# Patient Record
Sex: Female | Born: 1950 | Race: White | Hispanic: No | Marital: Married | Smoking: Former smoker
Health system: Southern US, Community
[De-identification: ages and names within clinical notes are randomized; demographics above are authoritative.]

## PROBLEM LIST (undated history)

## (undated) DIAGNOSIS — T7840XA Allergy, unspecified, initial encounter: Secondary | ICD-10-CM

## (undated) DIAGNOSIS — H269 Unspecified cataract: Secondary | ICD-10-CM

## (undated) DIAGNOSIS — M1712 Unilateral primary osteoarthritis, left knee: Secondary | ICD-10-CM

## (undated) DIAGNOSIS — I10 Essential (primary) hypertension: Secondary | ICD-10-CM

## (undated) DIAGNOSIS — E785 Hyperlipidemia, unspecified: Secondary | ICD-10-CM

## (undated) HISTORY — DX: Hyperlipidemia, unspecified: E78.5

## (undated) HISTORY — DX: Unilateral primary osteoarthritis, left knee: M17.12

## (undated) HISTORY — DX: Essential (primary) hypertension: I10

## (undated) HISTORY — DX: Allergy, unspecified, initial encounter: T78.40XA

## (undated) HISTORY — DX: Unspecified cataract: H26.9

---

## 1985-01-21 HISTORY — PX: TUBAL LIGATION: SHX77

## 1989-01-21 HISTORY — PX: APPENDECTOMY: SHX54

## 1989-01-21 HISTORY — PX: TOTAL ABDOMINAL HYSTERECTOMY: SHX209

## 1998-04-17 ENCOUNTER — Other Ambulatory Visit: Admission: RE | Admit: 1998-04-17 | Discharge: 1998-04-17 | Payer: Self-pay | Admitting: Obstetrics and Gynecology

## 1998-09-01 ENCOUNTER — Other Ambulatory Visit: Admission: RE | Admit: 1998-09-01 | Discharge: 1998-09-01 | Payer: Self-pay | Admitting: Gastroenterology

## 1998-09-01 ENCOUNTER — Encounter (INDEPENDENT_AMBULATORY_CARE_PROVIDER_SITE_OTHER): Payer: Self-pay | Admitting: Specialist

## 1998-10-09 ENCOUNTER — Encounter (INDEPENDENT_AMBULATORY_CARE_PROVIDER_SITE_OTHER): Payer: Self-pay

## 1998-10-09 ENCOUNTER — Inpatient Hospital Stay (HOSPITAL_COMMUNITY): Admission: EM | Admit: 1998-10-09 | Discharge: 1998-10-10 | Payer: Self-pay | Admitting: *Deleted

## 1999-03-26 ENCOUNTER — Other Ambulatory Visit: Admission: RE | Admit: 1999-03-26 | Discharge: 1999-03-26 | Payer: Self-pay | Admitting: *Deleted

## 2000-04-16 ENCOUNTER — Other Ambulatory Visit: Admission: RE | Admit: 2000-04-16 | Discharge: 2000-04-16 | Payer: Self-pay | Admitting: *Deleted

## 2001-04-28 ENCOUNTER — Other Ambulatory Visit: Admission: RE | Admit: 2001-04-28 | Discharge: 2001-04-28 | Payer: Self-pay | Admitting: *Deleted

## 2002-01-21 HISTORY — PX: CHOLECYSTECTOMY: SHX55

## 2002-05-03 ENCOUNTER — Encounter: Payer: Self-pay | Admitting: Family Medicine

## 2002-05-03 ENCOUNTER — Encounter: Admission: RE | Admit: 2002-05-03 | Discharge: 2002-05-03 | Payer: Self-pay | Admitting: Family Medicine

## 2002-05-05 ENCOUNTER — Other Ambulatory Visit: Admission: RE | Admit: 2002-05-05 | Discharge: 2002-05-05 | Payer: Self-pay | Admitting: *Deleted

## 2003-05-16 ENCOUNTER — Other Ambulatory Visit: Admission: RE | Admit: 2003-05-16 | Discharge: 2003-05-16 | Payer: Self-pay | Admitting: *Deleted

## 2004-03-28 ENCOUNTER — Encounter: Admission: RE | Admit: 2004-03-28 | Discharge: 2004-03-28 | Payer: Self-pay | Admitting: Family Medicine

## 2004-05-23 ENCOUNTER — Other Ambulatory Visit: Admission: RE | Admit: 2004-05-23 | Discharge: 2004-05-23 | Payer: Self-pay | Admitting: *Deleted

## 2005-06-26 ENCOUNTER — Other Ambulatory Visit: Admission: RE | Admit: 2005-06-26 | Discharge: 2005-06-26 | Payer: Self-pay | Admitting: *Deleted

## 2005-12-19 ENCOUNTER — Ambulatory Visit: Payer: Self-pay | Admitting: Family Medicine

## 2006-01-27 ENCOUNTER — Ambulatory Visit: Payer: Self-pay | Admitting: Family Medicine

## 2006-07-07 ENCOUNTER — Other Ambulatory Visit: Admission: RE | Admit: 2006-07-07 | Discharge: 2006-07-07 | Payer: Self-pay | Admitting: *Deleted

## 2006-08-25 ENCOUNTER — Encounter (INDEPENDENT_AMBULATORY_CARE_PROVIDER_SITE_OTHER): Payer: Self-pay | Admitting: Family Medicine

## 2007-01-28 ENCOUNTER — Ambulatory Visit: Payer: Self-pay | Admitting: Family Medicine

## 2007-01-28 DIAGNOSIS — I10 Essential (primary) hypertension: Secondary | ICD-10-CM | POA: Insufficient documentation

## 2007-01-28 LAB — CONVERTED CEMR LAB
BUN: 8 mg/dL (ref 6–23)
CO2: 30 meq/L (ref 19–32)
Calcium: 9.7 mg/dL (ref 8.4–10.5)
Chloride: 104 meq/L (ref 96–112)
Creatinine, Ser: 0.7 mg/dL (ref 0.4–1.2)
Direct LDL: 125.2 mg/dL
VLDL: 35 mg/dL (ref 0–40)

## 2007-01-29 ENCOUNTER — Encounter (INDEPENDENT_AMBULATORY_CARE_PROVIDER_SITE_OTHER): Payer: Self-pay | Admitting: *Deleted

## 2007-02-11 ENCOUNTER — Telehealth (INDEPENDENT_AMBULATORY_CARE_PROVIDER_SITE_OTHER): Payer: Self-pay | Admitting: *Deleted

## 2007-02-16 ENCOUNTER — Telehealth (INDEPENDENT_AMBULATORY_CARE_PROVIDER_SITE_OTHER): Payer: Self-pay | Admitting: *Deleted

## 2007-02-20 ENCOUNTER — Ambulatory Visit: Payer: Self-pay | Admitting: Gastroenterology

## 2007-02-20 LAB — CONVERTED CEMR LAB
ALT: 22 units/L (ref 0–35)
Basophils Relative: 0.7 % (ref 0.0–1.0)
Bilirubin, Direct: 0.1 mg/dL (ref 0.0–0.3)
Eosinophils Absolute: 0.1 10*3/uL (ref 0.0–0.6)
Eosinophils Relative: 2.2 % (ref 0.0–5.0)
HCT: 34.8 % — ABNORMAL LOW (ref 36.0–46.0)
Lymphocytes Relative: 36.8 % (ref 12.0–46.0)
MCV: 89.5 fL (ref 78.0–100.0)
Neutro Abs: 2.5 10*3/uL (ref 1.4–7.7)
Neutrophils Relative %: 50.8 % (ref 43.0–77.0)
Total Bilirubin: 0.7 mg/dL (ref 0.3–1.2)
Total Protein: 6.9 g/dL (ref 6.0–8.3)
WBC: 4.7 10*3/uL (ref 4.5–10.5)

## 2007-02-25 ENCOUNTER — Ambulatory Visit: Payer: Self-pay | Admitting: Cardiology

## 2007-03-04 ENCOUNTER — Ambulatory Visit: Payer: Self-pay | Admitting: Gastroenterology

## 2007-03-04 ENCOUNTER — Telehealth (INDEPENDENT_AMBULATORY_CARE_PROVIDER_SITE_OTHER): Payer: Self-pay | Admitting: *Deleted

## 2007-03-04 ENCOUNTER — Encounter (INDEPENDENT_AMBULATORY_CARE_PROVIDER_SITE_OTHER): Payer: Self-pay | Admitting: Family Medicine

## 2007-07-17 ENCOUNTER — Ambulatory Visit: Payer: Self-pay | Admitting: Family Medicine

## 2007-07-18 LAB — CONVERTED CEMR LAB
BUN: 13 mg/dL (ref 6–23)
Calcium: 9.4 mg/dL (ref 8.4–10.5)
Glucose, Bld: 97 mg/dL (ref 70–99)
Potassium: 3.9 meq/L (ref 3.5–5.3)

## 2007-07-20 ENCOUNTER — Encounter (INDEPENDENT_AMBULATORY_CARE_PROVIDER_SITE_OTHER): Payer: Self-pay | Admitting: *Deleted

## 2007-09-07 ENCOUNTER — Ambulatory Visit: Payer: Self-pay | Admitting: Family Medicine

## 2007-09-07 DIAGNOSIS — J019 Acute sinusitis, unspecified: Secondary | ICD-10-CM | POA: Insufficient documentation

## 2007-10-13 ENCOUNTER — Other Ambulatory Visit: Admission: RE | Admit: 2007-10-13 | Discharge: 2007-10-13 | Payer: Self-pay | Admitting: Gynecology

## 2007-11-23 ENCOUNTER — Ambulatory Visit: Payer: Self-pay | Admitting: Family Medicine

## 2007-11-23 DIAGNOSIS — N390 Urinary tract infection, site not specified: Secondary | ICD-10-CM | POA: Insufficient documentation

## 2007-11-23 LAB — CONVERTED CEMR LAB
Bilirubin Urine: NEGATIVE
Glucose, Urine, Semiquant: NEGATIVE
Protein, U semiquant: NEGATIVE
Specific Gravity, Urine: <1.005
pH: 5

## 2007-11-24 ENCOUNTER — Encounter: Payer: Self-pay | Admitting: Family Medicine

## 2007-12-01 ENCOUNTER — Telehealth (INDEPENDENT_AMBULATORY_CARE_PROVIDER_SITE_OTHER): Payer: Self-pay | Admitting: *Deleted

## 2008-03-15 ENCOUNTER — Ambulatory Visit: Payer: Self-pay | Admitting: Family Medicine

## 2008-03-15 LAB — CONVERTED CEMR LAB
Glucose, Urine, Semiquant: NEGATIVE
Nitrite: NEGATIVE
Specific Gravity, Urine: 1.02
pH: 6

## 2008-03-16 ENCOUNTER — Encounter: Payer: Self-pay | Admitting: Family Medicine

## 2008-03-23 ENCOUNTER — Encounter (INDEPENDENT_AMBULATORY_CARE_PROVIDER_SITE_OTHER): Payer: Self-pay | Admitting: *Deleted

## 2008-06-07 ENCOUNTER — Ambulatory Visit: Payer: Self-pay | Admitting: Family Medicine

## 2008-06-07 DIAGNOSIS — L255 Unspecified contact dermatitis due to plants, except food: Secondary | ICD-10-CM | POA: Insufficient documentation

## 2008-06-14 ENCOUNTER — Telehealth (INDEPENDENT_AMBULATORY_CARE_PROVIDER_SITE_OTHER): Payer: Self-pay | Admitting: *Deleted

## 2008-08-03 LAB — CONVERTED CEMR LAB: Pap Smear: NORMAL

## 2008-09-19 ENCOUNTER — Ambulatory Visit: Payer: Self-pay | Admitting: Family Medicine

## 2008-09-19 DIAGNOSIS — J069 Acute upper respiratory infection, unspecified: Secondary | ICD-10-CM | POA: Insufficient documentation

## 2009-03-16 ENCOUNTER — Ambulatory Visit: Payer: Self-pay | Admitting: Family Medicine

## 2009-03-16 ENCOUNTER — Encounter: Payer: Self-pay | Admitting: Internal Medicine

## 2009-03-16 DIAGNOSIS — M810 Age-related osteoporosis without current pathological fracture: Secondary | ICD-10-CM | POA: Insufficient documentation

## 2009-03-16 LAB — CONVERTED CEMR LAB
Ketones, urine, test strip: NEGATIVE
Nitrite: NEGATIVE
Specific Gravity, Urine: 1.02
Urobilinogen, UA: NEGATIVE
WBC Urine, dipstick: NEGATIVE

## 2009-03-17 ENCOUNTER — Telehealth (INDEPENDENT_AMBULATORY_CARE_PROVIDER_SITE_OTHER): Payer: Self-pay | Admitting: *Deleted

## 2009-03-17 ENCOUNTER — Encounter: Payer: Self-pay | Admitting: Family Medicine

## 2009-03-17 LAB — CONVERTED CEMR LAB
Albumin: 4.3 g/dL (ref 3.5–5.2)
BUN: 10 mg/dL (ref 6–23)
Basophils Absolute: 0 10*3/uL (ref 0.0–0.1)
CO2: 30 meq/L (ref 19–32)
Direct LDL: 124.1 mg/dL
Eosinophils Absolute: 0.1 10*3/uL (ref 0.0–0.7)
GFR calc non Af Amer: 77.56 mL/min (ref >60)
Glucose, Bld: 87 mg/dL (ref 70–99)
HCT: 37.2 % (ref 36.0–46.0)
HDL: 51.4 mg/dL (ref >39.00)
Lymphs Abs: 1.6 10*3/uL (ref 0.7–4.0)
MCHC: 33.7 g/dL (ref 30.0–36.0)
MCV: 92.8 fL (ref 78.0–100.0)
Monocytes Absolute: 0.4 10*3/uL (ref 0.1–1.0)
Monocytes Relative: 8.4 % (ref 3.0–12.0)
Neutro Abs: 2.6 10*3/uL (ref 1.4–7.7)
Platelets: 133 10*3/uL — ABNORMAL LOW (ref 150.0–400.0)
Potassium: 3.4 meq/L — ABNORMAL LOW (ref 3.5–5.1)
RDW: 12.4 % (ref 11.5–14.6)
TSH: 0.76 microintl units/mL (ref 0.35–5.50)
Total Bilirubin: 0.5 mg/dL (ref 0.3–1.2)

## 2009-03-21 LAB — CONVERTED CEMR LAB: Vit D, 25-Hydroxy: 30 ng/mL (ref 30–89)

## 2009-05-25 ENCOUNTER — Ambulatory Visit: Payer: Self-pay | Admitting: Family Medicine

## 2009-06-08 ENCOUNTER — Ambulatory Visit: Payer: Self-pay | Admitting: Family Medicine

## 2009-06-08 DIAGNOSIS — E785 Hyperlipidemia, unspecified: Secondary | ICD-10-CM | POA: Insufficient documentation

## 2009-06-09 LAB — CONVERTED CEMR LAB
AST: 31 units/L (ref 0–37)
Albumin: 4.3 g/dL (ref 3.5–5.2)
Alkaline Phosphatase: 66 units/L (ref 39–117)
Calcium: 9.6 mg/dL (ref 8.4–10.5)
Cholesterol: 262 mg/dL — ABNORMAL HIGH (ref 0–200)
GFR calc non Af Amer: 91.92 mL/min (ref >60)
HDL: 67.1 mg/dL (ref >39.00)
Potassium: 5 meq/L (ref 3.5–5.1)
Sodium: 142 meq/L (ref 135–145)
Total Bilirubin: 0.5 mg/dL (ref 0.3–1.2)
Total CHOL/HDL Ratio: 4
VLDL: 42.6 mg/dL — ABNORMAL HIGH (ref 0.0–40.0)
Vit D, 25-Hydroxy: 58 ng/mL (ref 30–89)

## 2009-06-15 ENCOUNTER — Encounter (INDEPENDENT_AMBULATORY_CARE_PROVIDER_SITE_OTHER): Payer: Self-pay | Admitting: *Deleted

## 2009-07-28 ENCOUNTER — Telehealth (INDEPENDENT_AMBULATORY_CARE_PROVIDER_SITE_OTHER): Payer: Self-pay | Admitting: *Deleted

## 2009-08-31 ENCOUNTER — Ambulatory Visit: Payer: Self-pay | Admitting: Family Medicine

## 2009-09-01 LAB — CONVERTED CEMR LAB
ALT: 20 units/L (ref 0–35)
AST: 27 units/L (ref 0–37)
Albumin: 4.2 g/dL (ref 3.5–5.2)
Total Bilirubin: 0.5 mg/dL (ref 0.3–1.2)

## 2009-09-05 ENCOUNTER — Telehealth (INDEPENDENT_AMBULATORY_CARE_PROVIDER_SITE_OTHER): Payer: Self-pay | Admitting: *Deleted

## 2009-09-05 ENCOUNTER — Ambulatory Visit: Payer: Self-pay | Admitting: Family Medicine

## 2009-09-05 LAB — CONVERTED CEMR LAB: Vit D, 25-Hydroxy: 55 ng/mL (ref 30–89)

## 2009-09-19 ENCOUNTER — Ambulatory Visit: Payer: Self-pay | Admitting: Family Medicine

## 2009-09-19 DIAGNOSIS — L259 Unspecified contact dermatitis, unspecified cause: Secondary | ICD-10-CM | POA: Insufficient documentation

## 2009-11-02 ENCOUNTER — Telehealth (INDEPENDENT_AMBULATORY_CARE_PROVIDER_SITE_OTHER): Payer: Self-pay | Admitting: *Deleted

## 2010-01-18 ENCOUNTER — Telehealth (INDEPENDENT_AMBULATORY_CARE_PROVIDER_SITE_OTHER): Payer: Self-pay | Admitting: *Deleted

## 2010-02-22 NOTE — Assessment & Plan Note (Signed)
Summary: poison ivey/kn   Vital Signs:  Patient profile:   60 year old female Weight:      148 pounds Temp:     98.8 degrees F oral Pulse rate:   60 / minute Pulse rhythm:   regular BP sitting:   130 / 90  (left arm)  Vitals Entered By: Aron Baba CMA Deborra Medina) (September 05, 2009 3:38 PM) CC: c/o poison ivy x3 days, Rash   History of Present Illness:  Rash      This is a 60 year old woman who presents with Rash.  The symptoms began 3 days ago.  Pt here c/o poison ivy for 3 days that is spreading.  The patient complains of blisters.  The rash is located on the face, chest, abdomen, and right hand.  The rash is worse with heat and better with cold.  The patient denies the following symptoms: fever, headache, facial swelling, tongue swelling, burning, difficulty breathing, abdominal pain, nausea, vomiting, diarrhea, dizziness, sore throat, dysuria, eye symptoms, arthralgias, and vaginal discharge.  The patient reports a history of new topical exposure.  The patient denies history of recent tick bite, recent tick exposure, other insect bite, recent infection, recent antibiotic use, new medication, new clothing, recent travel, pet/animal contact, thyroid disease, chronic liver disease, autoimmune disease, chronic edema, and prior STD.    Current Medications (verified): 1)  Metoprolol Tartrate 50 Mg  Tabs (Metoprolol Tartrate) .... Take One Tablet Twice Daily 2)  Calcium 500 Mg Tabs (Calcium) .Marland Kitchen.. 1 By Mouth Two Times A Day 3)  Fish Oil  Oil (Fish Oil) 4)  Vitamin D3 2000 Unit Caps (Cholecalciferol) .Marland Kitchen.. 1 By Mouth Daily. 5)  Zocor 40 Mg Tabs (Simvastatin) .Marland Kitchen.. 1 By Mouth At Bedtime 6)  Prednisone 10 Mg Tabs (Prednisone) .... 3 By Mouth Once Daily For 3 Days Then 2 By Mouth Once Daily For 3 Days Then 1 By Mouth Once Daily For 3 Days  Allergies (verified): 1)  ! 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  Past Medical History: Reviewed history from 01/28/2007 and no changes required. Hypertension  Past Surgical History: Reviewed history from 03/16/2009 and no changes required. Appendectomy Cholecystectomy Hysterectomy-- TAH BSO---  secondary fibroids Tubal ligation  Family History: Reviewed history from 03/16/2009 and no changes required. Family History of Colon CA 1st degree relative 62yo Family History of CAD Female 1st degree relative 45s Family History of CAD Female 1st degree relative 62yo Family History of Asthma Family History High cholesterol Family History Hypertension Family History Osteoporosis Family History Thyroid disease Family History of Thromboembolism clotting disorder  Social History: Reviewed history from 03/16/2009 and no changes required. 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 Alert and cooperative with normal attention span and concentration. No apparent delusions, illusions, hallucinations   Impression & Recommendations:  Problem # 1:  CONTACT DERMATITIS&OTHER ECZEMA DUE TO PLANTS (ICD-692.6) pt preferred not to get depo medrol injection Her updated medication list for this problem includes:    Prednisone 10 Mg Tabs (Prednisone) .Marland KitchenMarland KitchenMarland KitchenMarland Kitchen 3 by mouth once daily for 3 days then 2 by mouth once daily for 3 days then 1 by mouth once daily for 3 days  Discussed avoidance of triggers and symptomatic treatment.   Complete Medication List: 1)  Metoprolol Tartrate 50 Mg Tabs (Metoprolol tartrate) .... Take one tablet twice daily 2)  Calcium 500 Mg Tabs (Calcium) .Marland Kitchen.. 1 by mouth two times a day 3)  Fish Oil Oil (Fish oil) 4)  Vitamin D3 2000 Unit Caps (Cholecalciferol) .Marland Kitchen.. 1 by mouth daily. 5)  Zocor 40 Mg Tabs (Simvastatin) .Marland Kitchen.. 1 by mouth at bedtime 6)  Prednisone 10 Mg Tabs (Prednisone) .... 3 by mouth once daily for 3 days then 2 by mouth once daily for 3 days then 1 by mouth once daily for 3 days Prescriptions: PREDNISONE 10 MG TABS (PREDNISONE) 3 by mouth once daily for 3 days then 2 by mouth once daily for 3 days then 1 by mouth once daily for 3 days  #18 x 0   Entered and Authorized by:   Garnet Koyanagi DO   Signed by:   Garnet Koyanagi DO on 09/05/2009   Method used:   Electronically to        [facility] [phone]* (retail)       [address]       [place], [place]  [address]       Ph: [phone]       Fax: [phone]   RxID:   [id number]

## 2010-02-22 NOTE — Miscellaneous (Signed)
  Pt received letter she needed to start Zocor 40 mg and request that the refill be sent to [facility] on [address]. [place]  Jun 15, 2009 9:21 AM Medications Added ZOCOR 40 MG TABS (SIMVASTATIN) 1 by mouth at bedtime       Clinical Lists Changes  Medications: Added new medication of ZOCOR 40 MG TABS (SIMVASTATIN) 1 by mouth at bedtime - Signed Rx of ZOCOR 40 MG TABS (SIMVASTATIN) 1 by mouth at bedtime;  #30 x 2;  Signed;  Entered by: Allyn Kenner CMA;  Authorized by: Garnet Koyanagi DO;  Method used: Electronically to [facility] # [id number]*, [address] [place], [place], [place]  [address], Ph: [phone], Fax: [phone]    Prescriptions: ZOCOR 40 MG TABS (SIMVASTATIN) 1 by mouth at bedtime  #30 x 2   Entered by:   Allyn Kenner CMA   Authorized by:   Garnet Koyanagi DO   Signed by:   Allyn Kenner CMA on 06/15/2009   Method used:   Electronically to        [facility] # [phone]* (retail)       [address] [place], [place]  [address]       Ph: [phone]       Fax: [phone]   RxID:   [id number]

## 2010-02-22 NOTE — Assessment & Plan Note (Signed)
Summary: POISON IVY/KN   Vital Signs:  Patient profile:   60 year old female Weight:      144.8 pounds Temp:     98.3 degrees F oral Pulse rate:   72 / minute Pulse rhythm:   regular BP sitting:   120 / 74  (right arm)  Vitals Entered By: Aron Baba CMA Deborra Medina) (September 19, 2009 8:21 AM) CC: c/o poison ivy or rash on the abdomen   History of Present Illness: Rash went away with steroids--almost 100% then 3-4 days later it started again on waist in distribution of the waist band of her exercise pants.  Pt using otc steroid cream with little relief.  No other new topical exposure.  Current Medications (verified): 1)  Metoprolol Tartrate 50 Mg  Tabs (Metoprolol Tartrate) .... Take One Tablet Twice Daily 2)  Calcium 500 Mg Tabs (Calcium) .Marland Kitchen.. 1 By Mouth Two Times A Day 3)  Fish Oil  Oil (Fish Oil) 4)  Vitamin D3 2000 Unit Caps (Cholecalciferol) .Marland Kitchen.. 1 By Mouth Daily. 5)  Zocor 40 Mg Tabs (Simvastatin) .Marland Kitchen.. 1 By Mouth At Bedtime 6)  Elocon 0.1 % Crea (Mometasone Furoate) .... Apply Once Daily  Allergies (verified): 1)  ! * Actifed  Past History:  Past medical, surgical, family and social histories (including risk factors) reviewed for relevance to current acute and chronic problems.  Past Medical History: Reviewed history from 01/28/2007 and no changes required. Hypertension  Past Surgical History: Reviewed history from 03/16/2009 and no changes required. Appendectomy Cholecystectomy Hysterectomy-- TAH BSO---  secondary fibroids Tubal ligation  Family History: Reviewed history from 03/16/2009 and no changes required. Family History of Colon CA 1st degree relative 62yo Family History of CAD Female 1st degree relative 31s Family History of CAD Female 1st degree relative 62yo Family History of Asthma Family History High cholesterol Family History Hypertension Family History Osteoporosis Family History Thyroid disease Family History of Thromboembolism clotting  disorder  Social History: Reviewed history from 03/16/2009 and no changes required. 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     Impression & Recommendations:  Problem # 1:  CONTACT DERMATITIS&OTHER ECZEMA DUE UNSPEC CAUSE (ICD-692.9)  Her updated medication list for this problem includes:    Elocon 0.1 % Crea (Mometasone furoate) .Marland Kitchen... Apply once daily  Discussed avoidance of triggers and symptomatic treatment.  Pt leaving for [place] in 2 days--- she will call if rash does not resolve and we will set up with derm  Complete Medication List: 1)  Metoprolol Tartrate 50 Mg Tabs (Metoprolol tartrate) .... Take one tablet twice daily 2)  Calcium 500 Mg Tabs (Calcium) .Marland Kitchen.. 1 by mouth two times a day 3)  Fish Oil Oil (Fish oil) 4)  Vitamin D3 2000 Unit Caps (Cholecalciferol) .Marland Kitchen.. 1 by mouth daily. 5)  Zocor 40 Mg Tabs (Simvastatin) .Marland Kitchen.. 1 by mouth at bedtime 6)  Elocon 0.1 % Crea (Mometasone furoate) .... Apply once daily Prescriptions: ELOCON 0.1 % CREA (MOMETASONE FUROATE) apply once daily  #30g x 1   Entered and Authorized by:   Garnet Koyanagi DO   Signed by:   Garnet Koyanagi DO on 09/19/2009   Method used:   [facility] [address] (retail)       [address]       [place], [place]  [address]       Ph: [phone]       Fax: [phone]   RxID:   [id number]

## 2010-02-22 NOTE — Letter (Signed)
Summary: Cancer Screening/[facility] Personalized Risk Profile  Cancer Screening/[facility] Personalized Risk Profile   Imported By: Edmonia James 03/21/2009 10:37:44  _____________________________________________________________________  External Attachment:    Type:   Image     Comment:   External Document

## 2010-02-22 NOTE — Assessment & Plan Note (Signed)
Summary: posion ivy//fd   Vital Signs:  Patient profile:   60 year old female Weight:      143 pounds Pulse rate:   62 / minute Pulse rhythm:   regular BP sitting:   132 / 62  (left arm) Cuff size:   regular  Vitals Entered By: [place] (May 25, 2009 3:38 PM) CC: Pt here has had poision ivy x 4 days.    History of Present Illness: Pt here c/o poison ivy on face, arms and leg x 4 days.  She was in the yard this weekend but she also has dogs that go outside.   No other complaints.  Current Medications (verified): 1)  Metoprolol Tartrate 50 Mg  Tabs (Metoprolol Tartrate) .... Take One Tablet Twice Daily 2)  Calcium 500 Mg Tabs (Calcium) .Marland Kitchen.. 1 By Mouth Two Times A Day 3)  Fish Oil  Oil (Fish Oil) 4)  Vitamin D3 2000 Unit Caps (Cholecalciferol) .Marland Kitchen.. 1 By Mouth Daily. 5)  Prednisone 10 Mg Tabs (Prednisone) .... 3 By Mouth Once Daily For 3 Days Then 2 By Mouth Once Daily For 3 Days Then 1 By Mouth Once Daily For 3 Days  Allergies: 1)  ! * Actifed  Past History:  Past medical, surgical, family and social histories (including risk factors) reviewed for relevance to current acute and chronic problems.  Past Medical History: Reviewed history from 01/28/2007 and no changes required. Hypertension  Past Surgical History: Reviewed history from 03/16/2009 and no changes required. Appendectomy Cholecystectomy Hysterectomy-- TAH BSO---  secondary fibroids Tubal ligation  Family History: Reviewed history from 03/16/2009 and no changes required. Family History of Colon CA 1st degree relative 62yo Family History of CAD Female 1st degree relative 72s Family History of CAD Female 1st degree relative 62yo Family History of Asthma Family History High cholesterol Family History Hypertension Family History Osteoporosis Family History Thyroid disease Family History of Thromboembolism clotting disorder  Social History: Reviewed history from 03/16/2009 and no changes  required. 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     Impression & Recommendations:  Problem # 1:  CONTACT DERMATITIS&OTHER ECZEMA DUE TO PLANTS (ICD-692.6)  Her updated medication list for this problem includes:    Prednisone 10 Mg Tabs (Prednisone) .Marland KitchenMarland KitchenMarland KitchenMarland Kitchen 3 by mouth once daily for 3 days then 2 by mouth once daily for 3 days then 1 by mouth once daily for 3 days  Orders: Admin of Therapeutic Inj  intramuscular or subcutaneous (01749) Depo- Medrol 40m ([id number]  Discussed avoidance of triggers and symptomatic treatment.   Complete Medication List: 1)  Metoprolol Tartrate 50 Mg Tabs (Metoprolol tartrate) .... Take one tablet twice daily 2)  Calcium 500 Mg Tabs (Calcium) ..Marland Kitchen. 1 by mouth two times a day 3)  Fish Oil Oil (Fish oil) 4)  Vitamin D3 2000 Unit Caps (Cholecalciferol) ..Marland Kitchen. 1 by mouth daily. 5)  Prednisone 10 Mg Tabs (Prednisone) .... 3 by mouth once daily for 3 days then 2 by mouth once daily for 3 days then 1 by mouth once daily for 3 days  Patient Instructions: 1)  272.4  733.0  vita D, bmp, hep, lipid---ov 2 weeks after labs Prescriptions: PREDNISONE 10 MG TABS (  PREDNISONE) 3 by mouth once daily for 3 days then 2 by mouth once daily for 3 days then 1 by mouth once daily for 3 days  #18 x 0   Entered and Authorized by:   Garnet Koyanagi DO   Signed by:   Garnet Koyanagi DO on 05/25/2009   Method used:   Electronically to        [facility] [phone]* (retail)       [address]       [place], [place]  [address]       Ph: [phone]       Fax: [phone]   RxID:   [id number]    Medication  Administration  Injection # 1:    Medication: Depo- Medrol 49m    Diagnosis: CONTACT DERMATITIS&OTHER ECZEMA DUE TO PLANTS (ICD-692.6)    Route: IM    Site: RUOQ gluteus    Exp Date: 12/2009    Lot #: [id number]    Mfr: [email or web address]    Patient tolerated injection without complications    Given by: DAllyn KennerCMA (May 25, 2009 3:57 PM)  Orders Added: 1)  Admin of Therapeutic Inj  intramuscular or subcutaneous [96372] 2)  Depo- Medrol 865m[J1040] 3)  Est. Patient Level III [[id number][92493]

## 2010-02-22 NOTE — Assessment & Plan Note (Signed)
Summary: CPX AND SHINGLE SHOT TOO.PH   Vital Signs:  Patient profile:   60 year old female Height:      65.25 inches Weight:      144.38 pounds BMI:     23.93 Temp:     98.1 degrees F oral Pulse rate:   57 / minute Pulse rhythm:   regular BP sitting:   137 / 56  (left arm) Cuff size:   regular  Vitals Entered By: Allyn Kenner CMA (March 16, 2009 8:32 AM) CC: CPX, no pap. Pt is fasting   History of Present Illness: Pt here for cpe and labs---  pt sees Dr Jerre Simon for gyn exam.  No complaints.  Preventive Screening-Counseling & Management  Alcohol-Tobacco     Alcohol drinks/day: <1     Alcohol type: wine     Smoking Status: quit     Smoke Cessation Stage: quit     Packs/Day: 0.5     Year Started: 1968     Year Quit: 1984  Caffeine-Diet-Exercise     Caffeine use/day: 4     Does Patient Exercise: yes     Type of exercise: toning, cardio     Exercise (avg: min/session): 30-60     Times/week: 3  Hep-HIV-STD-Contraception     Dental Visit-last 6 months yes     Dental Care Counseling: not indicated; dental care within six months     SBE monthly: yes     SBE Education/Counseling: not indicated; SBE done regularly      Sexual History:  currently monogamous and married.        Drug Use:  no.    Current Medications (verified): 1)  Metoprolol Tartrate 50 Mg  Tabs (Metoprolol Tartrate) .... Take One Tablet Twice Daily 2)  Calcium 500 Mg Tabs (Calcium) .Marland Kitchen.. 1 By Mouth Two Times A Day  Allergies (verified): 1)  ! * Actifed  Past History:  Past Medical History: Last updated: 01/28/2007 Hypertension  Family History: Last updated: 03/16/2009 Family History of Colon CA 1st degree relative 62yo Family History of CAD Female 1st degree relative 82s Family History of CAD Female 1st degree relative 62yo Family History of Asthma Family History High cholesterol Family History Hypertension Family History Osteoporosis Family History Thyroid disease Family History of  Thromboembolism clotting disorder  Social History: Last updated: 03/16/2009 Occupation:  [employer]--[employer] housing--diapers Married Former Smoker Alcohol use-yes Drug use-no Regular exercise-yes  Risk Factors: Alcohol Use: <1 (03/16/2009) Caffeine Use: 4 (03/16/2009) Exercise: yes (03/16/2009)  Risk Factors: Smoking Status: quit (03/16/2009) Packs/Day: 0.5 (03/16/2009)  Past Surgical History: Appendectomy Cholecystectomy Hysterectomy-- TAH BSO---  secondary fibroids Tubal ligation  Family History: Reviewed history and no changes required. Family History of Colon CA 1st degree relative 62yo Family History of CAD Female 1st degree relative 50s Family History of CAD Female 1st degree relative 62yo Family History of Asthma Family History High cholesterol Family History Hypertension Family History Osteoporosis Family History Thyroid disease Family History of Thromboembolism clotting disorder  Social History: Reviewed history and no changes required. 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 Eyes:  Denies blurring, discharge, double vision,  eye irritation, eye pain, halos, itching, light sensitivity, red eye, vision loss-1 eye, and vision loss-both eyes; optho q2y. ENT:  Denies decreased hearing, difficulty swallowing, ear discharge, earache, hoarseness, nasal congestion, nosebleeds, postnasal drainage, ringing in ears, sinus pressure, and sore throat. CV:  Denies bluish discoloration of lips or nails, chest pain or discomfort, difficulty breathing at night, difficulty breathing  while lying down, fainting, fatigue, leg cramps with exertion, lightheadness, near fainting, palpitations, shortness of breath with exertion, swelling of feet, swelling of hands, and weight gain. Resp:  Denies chest discomfort, chest pain with inspiration, cough, coughing up blood, excessive snoring, hypersomnolence, morning headaches, pleuritic, shortness of breath, sputum productive, and wheezing. GI:  Denies abdominal pain, bloody stools, change in bowel habits, constipation, dark tarry stools, diarrhea, excessive appetite, gas, hemorrhoids, indigestion, loss of appetite, nausea, vomiting, vomiting blood, and yellowish skin color. GU:  Denies abnormal vaginal bleeding, decreased libido, discharge, dysuria, genital sores, hematuria, incontinence, nocturia, urinary frequency, and urinary hesitancy. MS:  Denies joint pain, joint redness, joint swelling, loss of strength, low back pain, mid back pain, muscle aches, muscle , cramps, muscle weakness, stiffness, and thoracic pain. Derm:  Denies changes in color of skin, changes in nail beds, dryness, excessive perspiration, flushing, hair loss, insect bite(s), itching, lesion(s), poor wound healing, and rash; Dr Tonia Brooms. Neuro:  Denies brief paralysis, difficulty with concentration, disturbances in coordination, falling down, headaches, inability to speak, memory loss, numbness, poor balance, seizures, sensation of room spinning, tingling, tremors, visual disturbances, and weakness. Psych:  Denies alternate hallucination ( auditory/visual), anxiety, depression, easily angered, easily tearful, irritability, mental problems, panic attacks, sense of great danger, suicidal thoughts/plans, thoughts of violence, unusual visions or sounds, and thoughts /plans of harming others. Endo:  Denies cold intolerance, excessive hunger, excessive thirst, excessive urination, heat intolerance, polyuria, and weight change. Heme:  Denies abnormal bruising, bleeding, enlarge lymph  nodes, fevers, pallor, and skin discoloration. Allergy:  Denies hives or rash, itching eyes, persistent infections, seasonal allergies, and sneezing.  Physical Exam  General:  Well-developed,well-nourished,in no acute distress; alert,appropriate and cooperative throughout examination Head:  Normocephalic and atraumatic without obvious abnormalities. No apparent alopecia or balding. Eyes:  vision grossly intact, pupils equal, pupils round, pupils reactive to light, and no injection.   Ears:  External ear exam shows no significant lesions or deformities.  Otoscopic examination reveals clear canals, tympanic membranes are intact bilaterally without bulging, retraction, inflammation or discharge. Hearing is grossly normal bilaterally. Nose:  External nasal examination shows no deformity or inflammation. Nasal mucosa are pink and moist without lesions or exudates. Mouth:  Oral mucosa and oropharynx without lesions or exudates.  Teeth in good repair. Neck:  No deformities, masses, or tenderness noted.no carotid bruits.   Breasts:  gyn Lungs:  Normal respiratory effort, chest expands symmetrically. Lungs are clear to auscultation, no crackles or wheezes. Heart:  normal rate and no murmur.   Abdomen:  Bowel sounds positive,abdomen soft and non-tender without masses, organomegaly or hernias noted. Rectal:  gyn Genitalia:  gyn Msk:  normal ROM, no joint tenderness, no joint swelling, no joint warmth, no redness over joints, no joint deformities, no joint instability, and no crepitation.   Pulses:  R posterior tibial normal, R dorsalis pedis normal, R carotid normal, L posterior tibial normal, L dorsalis pedis normal, and L carotid normal.   Extremities:  No clubbing, cyanosis, edema, or deformity noted with normal full range of motion of all joints.   Neurologic:  No cranial nerve deficits noted. Station  and gait are normal. Plantar reflexes are down-going bilaterally. DTRs are symmetrical throughout.  Sensory, motor and coordinative functions appear intact. Skin:  Intact without suspicious lesions or rashes Cervical Nodes:  No lymphadenopathy noted Psych:  Oriented X3, memory intact for recent and remote, normally interactive, good eye contact, not anxious appearing, and not depressed appearing.     Impression & Recommendations:  Problem # 1:  PREVENTIVE HEALTH CARE (ICD-V70.0)  Orders: Venipuncture (60454) TLB-Lipid Panel (80061-LIPID) TLB-BMP (Basic Metabolic Panel-BMET) (09811-BJYNWGN) TLB-CBC Platelet - w/Differential (85025-CBCD) TLB-Hepatic/Liver Function Pnl (80076-HEPATIC) TLB-TSH (Thyroid Stimulating Hormone) (84443-TSH) T-Vitamin D (25-Hydroxy) (56213-08657) EKG w/ Interpretation (93000) UA Dipstick w/o Micro (manual) (81002)  Problem # 2:  OSTEOPOROSIS (ICD-733.00)  Her updated medication list for this problem includes:    Calcium 500 Mg Tabs (Calcium) .Marland Kitchen... 1 by mouth two times a day  Orders: Venipuncture (84696) TLB-Lipid Panel (80061-LIPID) TLB-BMP (Basic Metabolic Panel-BMET) (29528-UXLKGMW) TLB-CBC Platelet - w/Differential (85025-CBCD) TLB-Hepatic/Liver Function Pnl (80076-HEPATIC) TLB-TSH (Thyroid Stimulating Hormone) (84443-TSH) T-Vitamin D (25-Hydroxy) (10272-53664) EKG w/ Interpretation (93000)  Problem # 3:  HYPERTENSION (ICD-401.9)  Her updated medication list for this problem includes:    Metoprolol Tartrate 50 Mg Tabs (Metoprolol tartrate) .Marland Kitchen... Take one tablet twice daily  Orders: Venipuncture (40347) TLB-Lipid Panel (80061-LIPID) TLB-BMP (Basic Metabolic Panel-BMET) (42595-GLOVFIE) TLB-CBC Platelet - w/Differential (85025-CBCD) TLB-Hepatic/Liver Function Pnl (80076-HEPATIC) TLB-TSH (Thyroid Stimulating Hormone) (84443-TSH) T-Vitamin D (25-Hydroxy) (33295-18841) EKG w/ Interpretation (93000)  BP today: 137/56 Prior BP: 130/90 (09/19/2008)  Labs Reviewed: K+: 3.9 (07/17/2007) Creat: : 0.76 (07/17/2007)   Chol: 211 (01/28/2007)   HDL:  54.2 (01/28/2007)   LDL: DEL (01/28/2007)   TG: 176 (01/28/2007)  Complete Medication List: 1)  Metoprolol Tartrate 50 Mg Tabs (Metoprolol tartrate) .... Take one tablet twice daily 2)  Calcium 500 Mg Tabs (Calcium) .Marland Kitchen.. 1 by mouth two times a day  Other Orders: Zoster (Shingles) Vaccine Live [phone]) Admin 1st Vaccine [id number])   Immunization History:  Influenza Immunization History:    Influenza:  Fluvax 3+ (11/02/2008)  Immunizations Administered:  Zostavax # 1:    Vaccine Type: Zostavax    Site: left deltoid    Mfr: Merck    Dose: 0.5 ml    Route: [place]    Given by: Allyn Kenner CMA    Exp. Date: 02/14/2010    Lot #: [id number]   Last Colonoscopy:  normal (01/22/2003 11:23:47 AM) Colonoscopy Result Date:  03/02/2007 Colonoscopy Result:  diverticulosis,   Colonoscopy Next Due:  5 yr PAP Result Date:  08/03/2008 PAP Result:  normal PAP Next Due:  1 yr Mammogram Result Date:  08/03/2008 Mammogram Result:  normal Mammogram Next Due:  1 yr   EKG  Procedure date:  03/16/2009  Findings:      Sinus bradycardia with rate of:  57bpm    Immunization History:  Influenza Immunization History:    Influenza:  fluvax 3+ (11/02/2008)  Immunizations Administered:  Zostavax # 1:    Vaccine Type: Zostavax    Site: left deltoid    Mfr: Merck    Dose: 0.5 ml    Route: [place]    Given by: Allyn Kenner CMA    Exp. Date: 02/14/2010    Lot #: [id number]  Laboratory Results   Urine Tests   Date/Time Reported: March 16, 2009 10:50 AM   Routine Urinalysis   Color: yellow Appearance: Clear Glucose: negative   (Normal Range: Negative) Bilirubin: negative   (Normal Range: Negative) Ketone: negative   (Normal Range: Negative) Spec. Gravity:  1.020   (Normal Range: 1.003-1.035) Blood: negative   (Normal Range: Negative) pH: 5.0   (Normal Range: 5.0-8.0) Protein: trace   (Normal Range: Negative) Urobilinogen: negative   (Normal Range: 0-1) Nitrite: negative   (Normal Range:  Negative) Leukocyte Esterace: negative   (Normal Range: Negative)    Comments: Heath Lark  March 16, 2009 10:51 AM

## 2010-02-22 NOTE — Progress Notes (Signed)
Summary: refill 90 day supply  Phone Note Refill Request Message from:  Patient on November 02, 2009 9:06 AM  Refills Requested: Medication #1:  METOPROLOL TARTRATE 50 MG  TABS Take one tablet twice daily  Medication #2:  ZOCOR 40 MG TABS 1 by mouth at bedtime. patient wants new rx for 90 day supply - she will pick rx up to mail to mail order  Initial call taken by: Arbie [place],  November 02, 2009 9:08 AM    Prescriptions: ZOCOR 40 MG TABS (SIMVASTATIN) 1 by mouth at bedtime  #90 x 1   Entered by:   Aron Baba CMA ([place])   Authorized by:   Garnet Koyanagi DO   Signed by:   Aron Baba CMA ([place]) on 11/02/2009   Method used:   Print then Give to Patient   RxID:   [id number] METOPROLOL TARTRATE 50 MG  TABS (METOPROLOL TARTRATE) Take one tablet twice daily  #180 x 1   Entered by:   Aron Baba CMA ([place])   Authorized by:   Garnet Koyanagi DO   Signed by:   Aron Baba CMA ([place]) on 11/02/2009   Method used:   Print then Give to Patient   RxID:   [id number]  pt notified to pick up RX.    Sargeant Deborra Medina)  November 02, 2009 9:14 AM

## 2010-02-22 NOTE — Progress Notes (Signed)
Summary: Lab Results   Phone Note Outgoing Call   Call placed by: Allyn Kenner CMA,  March 17, 2009 4:36 PM Summary of Call: Regarding lab results, LMTCB:  K low---  send K rich diet--- have pat eat one thing on list each day. TG elevated--- diet and exercise can help,  Fish oil 4 g daily,  LDL goal < 100----recheck 3 months---if still elevated we will need to discuss medication Signed by Garnet Koyanagi DO on 03/16/2009 at 10:49 PM   Follow-up for Phone Call        [facility].Allyn Kenner CMA  March 22, 2009 2:12 PM   Additional Follow-up for Phone Call Additional follow up Details #1::        Pt is aware. Allyn Kenner CMA  March 22, 2009 4:57 PM     New/Updated Medications: FISH OIL  OIL (FISH OIL)  VITAMIN D3 2000 UNIT CAPS (CHOLECALCIFEROL) 1 by mouth daily. Prescriptions: VITAMIN D3 2000 UNIT CAPS (CHOLECALCIFEROL) 1 by mouth daily.  #30 x 3   Entered by:   Allyn Kenner CMA   Authorized by:   Garnet Koyanagi DO   Signed by:   Allyn Kenner CMA on 03/22/2009   Method used:   Electronically to        [facility] [phone]* (retail)       [address]       [place], [place]  [address]       Ph: [phone]       Fax: [phone]   RxID:   [id number]

## 2010-02-22 NOTE — Progress Notes (Signed)
Summary: Simvastatin, metoprolol refills  Phone Note Refill Request Call back at cell - [phone] Message from:  Patient on January 18, 2010 2:00 PM  Refills Requested: Medication #1:  METOPROLOL TARTRATE 50 MG  TABS Take one tablet twice daily   Notes: needs 90 day prescription  Medication #2:  ZOCOR 40 MG TABS 1 by mouth at bedtime.   Notes: needs 90 day prescription please call her to pick up prescriptions--she cant remember name of mail order pharmacy  (mentioned [facility], but said that was wrong--maybe she means [facility]???)  Next Appointment Scheduled: none Initial call taken by: Berneta Sages,  January 18, 2010 2:06 PM  Follow-up for Phone Call        pt aware RX will be ready for pickup tomorrow Follow-up by: Aron Baba CMA Deborra Medina),  January 18, 2010 3:47 PM    Prescriptions: ZOCOR 40 MG TABS (SIMVASTATIN) 1 by mouth at bedtime  #90 x 0   Entered by:   Aron Baba CMA ([place])   Authorized by:   Garnet Koyanagi DO   Signed by:   Aron Baba CMA ([place]) on 01/18/2010   Method used:   Print then Give to Patient   RxID:   [id number] METOPROLOL TARTRATE 50 MG  TABS (METOPROLOL TARTRATE) Take one tablet twice daily  #180 x 0   Entered by:   Aron Baba CMA ([place])   Authorized by:   Garnet Koyanagi DO   Signed by:   Aron Baba CMA ([place]) on 01/18/2010   Method used:   Print then Give to Patient   RxID:   [id number]

## 2010-02-22 NOTE — Progress Notes (Signed)
Summary: refill  Phone Note Refill Request Call back at [phone]   Refills Requested: Medication #1:  VITAMIN D3 2000 UNIT CAPS 1 by mouth daily. pt left VM that she would like rx sent to pharmacy..........Marland KitchenFelecia Deloach CMA  July 28, 2009 2:40 PM      Prescriptions: VITAMIN D3 2000 UNIT CAPS (CHOLECALCIFEROL) 1 by mouth daily.  #30 x 6   Entered by:   Rolla Flatten CMA   Authorized by:   Garnet Koyanagi DO   Signed by:   Rolla Flatten CMA on 07/28/2009   Method used:   Faxed to ...       [facility] [phone]* (retail)       [address]       [place], [place]  [address]       Ph: [phone]       Fax: [phone]   RxID:   [id number]

## 2010-02-22 NOTE — Progress Notes (Signed)
Summary: posion ivy   Phone Note Call from Patient   Caller: Patient Summary of Call: pt states that she has posion ivy again and would like to have a rx sent in to pharmacy. pt last seen on 05-25-09 and rx PREDNISONE 10 MG TABS  pls advise.......Marland KitchenFelecia Deloach CMA  September 05, 2009 11:44 AM   Follow-up for Phone Call        i don't typically call in steroids w/out the pt being seen.  will defer decision on that to Dr Etter Sjogren who returns this afternoon. Follow-up by: Annye Asa MD,  September 05, 2009 11:46 AM  Additional Follow-up for Phone Call Additional follow up Details #1::        i agree--needs ov Additional Follow-up by: Garnet Koyanagi DO,  September 05, 2009 1:23 PM    Additional Follow-up for Phone Call Additional follow up Details #2::    lmtcb.Elna Breslow  September 05, 2009 1:58 PM  pt aware will schedule appt.Babs Sciara Deloach CMA  September 05, 2009 2:00 PM

## 2010-05-15 ENCOUNTER — Encounter: Payer: Self-pay | Admitting: Family Medicine

## 2010-05-15 ENCOUNTER — Ambulatory Visit (INDEPENDENT_AMBULATORY_CARE_PROVIDER_SITE_OTHER): Payer: BC Managed Care – PPO | Admitting: Family Medicine

## 2010-05-15 ENCOUNTER — Emergency Department (HOSPITAL_COMMUNITY)
Admission: EM | Admit: 2010-05-15 | Discharge: 2010-05-16 | Disposition: A | Discharge status: Home / Self Care | Payer: BC Managed Care – PPO | Attending: Emergency Medicine | Admitting: Emergency Medicine

## 2010-05-15 ENCOUNTER — Emergency Department (HOSPITAL_COMMUNITY): Payer: BC Managed Care – PPO

## 2010-05-15 DIAGNOSIS — R079 Chest pain, unspecified: Secondary | ICD-10-CM

## 2010-05-15 DIAGNOSIS — R0789 Other chest pain: Secondary | ICD-10-CM | POA: Insufficient documentation

## 2010-05-15 DIAGNOSIS — I10 Essential (primary) hypertension: Secondary | ICD-10-CM

## 2010-05-15 DIAGNOSIS — R110 Nausea: Secondary | ICD-10-CM | POA: Insufficient documentation

## 2010-05-15 DIAGNOSIS — R1013 Epigastric pain: Secondary | ICD-10-CM | POA: Insufficient documentation

## 2010-05-15 DIAGNOSIS — I498 Other specified cardiac arrhythmias: Secondary | ICD-10-CM | POA: Insufficient documentation

## 2010-05-15 DIAGNOSIS — R209 Unspecified disturbances of skin sensation: Secondary | ICD-10-CM | POA: Insufficient documentation

## 2010-05-15 DIAGNOSIS — Z79899 Other long term (current) drug therapy: Secondary | ICD-10-CM | POA: Insufficient documentation

## 2010-05-15 DIAGNOSIS — R5381 Other malaise: Secondary | ICD-10-CM | POA: Insufficient documentation

## 2010-05-15 DIAGNOSIS — R5383 Other fatigue: Secondary | ICD-10-CM | POA: Insufficient documentation

## 2010-05-15 DIAGNOSIS — M79609 Pain in unspecified limb: Secondary | ICD-10-CM | POA: Insufficient documentation

## 2010-05-15 DIAGNOSIS — Z7982 Long term (current) use of aspirin: Secondary | ICD-10-CM | POA: Insufficient documentation

## 2010-05-15 LAB — CBC
Hemoglobin: 15 g/dL (ref 12.0–15.0)
MCH: 31.2 pg (ref 26.0–34.0)
MCHC: 35.8 g/dL (ref 30.0–36.0)
MCV: 87.1 fL (ref 78.0–100.0)
Platelets: 161 10*3/uL (ref 150–400)

## 2010-05-15 LAB — BASIC METABOLIC PANEL
BUN: 7 mg/dL (ref 6–23)
CO2: 27 mEq/L (ref 19–32)
Calcium: 9.9 mg/dL (ref 8.4–10.5)
Creatinine, Ser: 0.67 mg/dL (ref 0.4–1.2)
GFR calc Af Amer: >60 mL/min (ref >60)

## 2010-05-15 LAB — DIFFERENTIAL
Basophils Relative: 0 % (ref 0–1)
Eosinophils Absolute: 0.1 10*3/uL (ref 0.0–0.7)
Monocytes Absolute: 0.4 10*3/uL (ref 0.1–1.0)
Monocytes Relative: 5 % (ref 3–12)

## 2010-05-15 LAB — URINALYSIS, ROUTINE W REFLEX MICROSCOPIC
Hgb urine dipstick: NEGATIVE
Ketones, ur: NEGATIVE mg/dL
Protein, ur: 30 mg/dL — AB
Urobilinogen, UA: 0.2 mg/dL (ref 0.0–1.0)

## 2010-05-15 LAB — POCT CARDIAC MARKERS
CKMB, poc: <1 ng/mL — ABNORMAL LOW (ref 1.0–8.0)
CKMB, poc: <1 ng/mL — ABNORMAL LOW (ref 1.0–8.0)
Myoglobin, poc: 46.8 ng/mL (ref 12–200)
Myoglobin, poc: 38.8 ng/mL (ref 12–200)
Troponin i, poc: <0.05 ng/mL (ref 0.00–0.09)

## 2010-05-15 LAB — URINE MICROSCOPIC-ADD ON

## 2010-05-15 LAB — LIPASE, BLOOD: Lipase: 25 U/L (ref 11–59)

## 2010-05-15 NOTE — Patient Instructions (Signed)
Please go to the [facility] ER and tell them you are having chest pain We must rule out your heart as the cause of your symptoms- hopefully this is a virus but we must be sure Call with any questions or concerns Hang in there!!!

## 2010-05-15 NOTE — Progress Notes (Signed)
  Subjective:    Patient ID: Tiffany Graham, female    DOB: [birth year], 60 y.o.   MRN: [id number]  HPI Abd pain- sxs started yesterday morning.  Thought it was indigestion, sxs did not improve and she went home from work.  + nausea, no vomiting.  Pain described as 'sharp, severe at times- other times just achey'.  Pain is substernal/epigastric and radiated under L ribs last night.  No longer having L sided pain.  + gas- belching and farting but this doesn't relieve pain.  Taking Pepto w/out relief.  Has been having smaller episodes off and on for the last month.  Pt reports it feels similar to when she needed her gallbladder out.  Had diarrhea Sunday night but this has resolved.  Reports some chest heaviness and arm numbness- reports normal stress test 'a couple of years ago'.  Last night BP was 200/83- having HA.  Reports BP has been 'creeping up'.  Has had some diaphoresis, SOB.   Review of Systems For ROS see HPI     Objective:   Physical Exam  Constitutional: She is oriented to person, place, and time. She appears well-developed and well-nourished.       Uncomfortable appearing  HENT:  Head: Normocephalic and atraumatic.  Eyes: Conjunctivae are normal. Pupils are equal, round, and reactive to light.  Neck: Normal range of motion. Neck supple. No thyromegaly present.  Cardiovascular: Regular rhythm, normal heart sounds and normal pulses.  Bradycardia present.   Pulmonary/Chest: Effort normal and breath sounds normal. No respiratory distress. She has no wheezes.  Abdominal: Soft. Bowel sounds are normal. She exhibits no distension. There is no tenderness. There is no rebound and no guarding.  Lymphadenopathy:    She has no cervical adenopathy.  Neurological: She is alert and oriented to person, place, and time. No cranial nerve deficit.  Skin: Skin is warm and dry.          Assessment & Plan:

## 2010-05-15 NOTE — Assessment & Plan Note (Signed)
Very high in office today- symptomatic w/ headache and chest pain.  Will go to ER for evaluation and possible R/O MI.

## 2010-05-15 NOTE — Assessment & Plan Note (Signed)
Pain is more substernal than epigastric.  No TTP on exam.  Will defer labs to ER work up.

## 2010-05-15 NOTE — Assessment & Plan Note (Signed)
Pt's pain is concerning for cardiac.  Has EKG changes- T wave inversion in V1 and changes in V3-V6.  Has seen Dr Fransico Him at [facility] in the past.  Given ASA in office and recommended EMS transport to [facility].  Pt declined EMS transport but husband is picking her up.

## 2010-05-16 DIAGNOSIS — R072 Precordial pain: Secondary | ICD-10-CM

## 2010-05-24 ENCOUNTER — Ambulatory Visit (INDEPENDENT_AMBULATORY_CARE_PROVIDER_SITE_OTHER): Payer: BC Managed Care – PPO | Admitting: Family Medicine

## 2010-05-24 ENCOUNTER — Encounter: Payer: Self-pay | Admitting: Family Medicine

## 2010-05-24 DIAGNOSIS — I10 Essential (primary) hypertension: Secondary | ICD-10-CM

## 2010-05-24 DIAGNOSIS — E785 Hyperlipidemia, unspecified: Secondary | ICD-10-CM

## 2010-05-24 MED ORDER — ASPIRIN 81 MG PO CHEW: 81.0000 mg | CHEWABLE_TABLET | Freq: Every day | ORAL | Status: AC

## 2010-05-24 MED ORDER — METOPROLOL TARTRATE 50 MG PO TABS: ORAL_TABLET | ORAL | Status: DC

## 2010-05-24 MED ORDER — GLUCOSAMINE-CHONDROITIN 500-400 MG PO TABS: 1.0000 | ORAL_TABLET | Freq: Three times a day (TID) | ORAL | Status: AC

## 2010-05-24 MED ORDER — OMEPRAZOLE 20 MG PO CPDR: 20.0000 mg | DELAYED_RELEASE_CAPSULE | Freq: Every day | ORAL | Status: DC

## 2010-05-24 NOTE — Progress Notes (Signed)
  Subjective:    Patient here for follow-up of elevated blood pressure.  She is not exercising and is adherent to a low-salt diet.  Blood pressure is well controlled at home. Cardiac symptoms: mid epigastric pain. Patient denies: fatigue, irregular heart beat and palpitations. Cardiovascular risk factors: advanced age (older than 72 for men, 90 for women) and hypertension. Use of agents associated with hypertension: none. History of target organ damage: none.  The following portions of the patient's history were reviewed and updated as appropriate: allergies, current medications, past family history, past medical history, past social history, past surgical history and problem list.  Review of Systems Pertinent items are noted in HPI.     Objective:    BP 138/76  Pulse 72  Wt 145 lb (65.772 kg) General appearance: alert, cooperative, appears stated age and no distress Neck: no adenopathy, no carotid bruit, no JVD, supple, symmetrical, trachea midline and thyroid not enlarged, symmetric, no tenderness/mass/nodules Lungs: clear to auscultation bilaterally Heart: regular rate and rhythm, S1, S2 normal, no murmur, click, rub or gallop Abdomen: soft, non-tender; bowel sounds normal; no masses,  no organomegaly Extremities: extremities normal, atraumatic, no cyanosis or edema Neurologic: Grossly normal    Assessment:    Hypertension, normal blood pressure . Evidence of target organ damage: none.    Plan:    Medication: increase to toprol metoprolol 1 in am and 2 in pm.

## 2010-05-24 NOTE — Patient Instructions (Signed)

## 2010-06-01 ENCOUNTER — Ambulatory Visit: Payer: BC Managed Care – PPO | Admitting: Family Medicine

## 2010-06-01 ENCOUNTER — Other Ambulatory Visit: Payer: BC Managed Care – PPO

## 2010-06-05 NOTE — Assessment & Plan Note (Signed)
[facility] OFFICE NOTE   NAME:Tiffany Graham                        MRN:          [id number]  DATE:02/20/2007                            DOB:          Apr 19, 1950    Mrs. Graham is a very pleasant 60 year old white female, who was  referred for evaluation of intermittent right lower quadrant pain for  several years, rather persistent over the last two months.   Tiffany Graham has a long history of recurrent colon polyps, has a positive family  history of colon carcinoma in her father.  Her last colonoscopy was in  September 2005.  At that time, she was noted to have a somewhat  redundant and tortuous colon.  She denies rectal bleeding or melena, but  does have alternating diarrhea and constipation and has now developed  steady right lower quadrant pain, without any real precipitating or  alleviating elements.  She has had no anorexia, weight-loss, nausea and  vomiting or systemic complaints.  She denies any upper GI or  hepatobiliary problems.  She has not had recent x-rays or evaluations,  except for normal metabolic profile.  It is of note that the patient has  had previous total abdominal hysterectomy, bilateral oophorectomy in  1990, is followed by Dr. Serafina Graham.  She also is six years status post  cholecystectomy.   PAST MEDICAL HISTORY:  Remarkable for mild essential hypertension and  she does take metoprolol 50 mg a day.   She has had urticaria with ACTIFED use, but otherwise denies drug  allergies.   FAMILY HISTORY:  Besides colon carcinoma in her father, there is  atherosclerotic cardiovascular disease in her parents and brother.  She  does have brothers that also have colon polyps.   SOCIAL HISTORY:  She is married, lives with her husband and works as a  Electrical engineer.  She does not smoke or abuse ethanol.   REVIEW OF SYSTEMS:  Noncontributory.   Her surgery was done by Dr. Annabell Graham in 2000 for  cholelithiasis.  I  do not have any records from Dr. Warnell Graham for review.   EXAMINATION:  She is an attractive, healthy-appearing, white female,  appearing younger than her stated age.  She is in no acute distress.  She is 5 feet 5 and weighs 146 pounds.  Blood pressure is 110/72, pulse  was 64 and regular.  I could not appreciate stigmata of chronic liver disease.  ABDOMINAL EXAM:  Showed no organomegaly, masses, tenderness or  distention.  Bowel sounds were normal.  Inspection of the rectum was unremarkable, as was rectal exam.  There  were no rectal masses or tenderness and stool was guaiac-negative.  I could not appreciate any inguinal hernias or other abnormalities in  the perineal area.   ASSESSMENT:  1. Probable right lower quadrant adhesions from previous pelvic      surgery.  Her symptoms do not seem consistent with femoral or      inguinal hernias.  2. Family history of colon cancer.  Patient is due for followup      colonoscopy exam.  At  time of colonoscopy, of course, we will be      able to examine her ileocecal area.  There is nothing in her      history to suggest inflammatory bowel disease.  3. Well-controlled essential hypertension.  4. Status post cholecystectomy.   RECOMMENDATIONS:  1. Check abdominopelvic CT scan.  2. CBC, sed rate, liver profile.  3. I will go ahead and schedule this patient for colonoscopy followup      at her convenience, after CT scan has been reported.  4. Continued followup with Dr. Cletus Graham and Dr. Warnell Graham as previously      planned.     Tiffany Graham. Tiffany Iles, MD, Tiffany Graham, Tiffany Graham  Electronically Signed    DRP/MedQ  DD: 02/20/2007  DT: 02/20/2007  Job #: [id number]   cc:   Tiffany Graham, M.D.  Tiffany Graham. Tiffany Graham, M.D.

## 2010-06-06 ENCOUNTER — Other Ambulatory Visit: Payer: Self-pay | Admitting: *Deleted

## 2010-06-06 DIAGNOSIS — I10 Essential (primary) hypertension: Secondary | ICD-10-CM

## 2010-06-06 DIAGNOSIS — E785 Hyperlipidemia, unspecified: Secondary | ICD-10-CM

## 2010-06-07 ENCOUNTER — Other Ambulatory Visit (INDEPENDENT_AMBULATORY_CARE_PROVIDER_SITE_OTHER): Payer: BC Managed Care – PPO

## 2010-06-07 DIAGNOSIS — E785 Hyperlipidemia, unspecified: Secondary | ICD-10-CM

## 2010-06-07 DIAGNOSIS — I10 Essential (primary) hypertension: Secondary | ICD-10-CM

## 2010-06-07 LAB — LIPID PANEL
HDL: 49.6 mg/dL (ref >39.00)
LDL Cholesterol: 67 mg/dL (ref 0–99)
Total CHOL/HDL Ratio: 3
Triglycerides: 173 mg/dL — ABNORMAL HIGH (ref 0.0–149.0)
VLDL: 34.6 mg/dL (ref 0.0–40.0)

## 2010-06-07 LAB — POCT URINALYSIS DIPSTICK
Bilirubin, UA: NEGATIVE
Blood, UA: NEGATIVE
Glucose, UA: NEGATIVE
Nitrite, UA: NEGATIVE
Spec Grav, UA: 1.015
Urobilinogen, UA: 0.2

## 2010-06-07 LAB — BASIC METABOLIC PANEL
CO2: 29 mEq/L (ref 19–32)
Glucose, Bld: 78 mg/dL (ref 70–99)
Potassium: 4.3 mEq/L (ref 3.5–5.1)
Sodium: 143 mEq/L (ref 135–145)

## 2010-06-07 LAB — HEPATIC FUNCTION PANEL
Albumin: 4 g/dL (ref 3.5–5.2)
Total Bilirubin: 0.4 mg/dL (ref 0.3–1.2)

## 2010-06-08 LAB — VITAMIN D 25 HYDROXY (VIT D DEFICIENCY, FRACTURES): Vit D, 25-Hydroxy: 40 ng/mL (ref 30–89)

## 2010-06-20 ENCOUNTER — Encounter: Payer: Self-pay | Admitting: *Deleted

## 2010-08-01 ENCOUNTER — Other Ambulatory Visit: Payer: Self-pay | Admitting: *Deleted

## 2010-08-01 MED ORDER — METOPROLOL TARTRATE 50 MG PO TABS: ORAL_TABLET | ORAL | Status: DC

## 2010-08-01 MED ORDER — SIMVASTATIN 40 MG PO TABS: 40.0000 mg | ORAL_TABLET | Freq: Every day | ORAL | Status: DC

## 2010-08-01 NOTE — Telephone Encounter (Signed)
Rx sent to pharmacy   

## 2010-09-14 ENCOUNTER — Other Ambulatory Visit: Payer: Self-pay

## 2010-09-14 ENCOUNTER — Other Ambulatory Visit: Payer: Self-pay | Admitting: Family Medicine

## 2010-09-14 MED ORDER — METOPROLOL TARTRATE 50 MG PO TABS: ORAL_TABLET | ORAL | Status: DC

## 2010-09-14 MED ORDER — SIMVASTATIN 40 MG PO TABS: 40.0000 mg | ORAL_TABLET | Freq: Every day | ORAL | Status: DC

## 2010-09-14 NOTE — Telephone Encounter (Signed)
Reprint Rx due to quantity error   KP

## 2010-09-14 NOTE — Telephone Encounter (Signed)
Labs due in November ---patient aware RX ready for pick up     KP

## 2010-11-23 ENCOUNTER — Other Ambulatory Visit (INDEPENDENT_AMBULATORY_CARE_PROVIDER_SITE_OTHER): Payer: BC Managed Care – PPO

## 2010-11-23 DIAGNOSIS — E785 Hyperlipidemia, unspecified: Secondary | ICD-10-CM

## 2010-11-23 LAB — HEPATIC FUNCTION PANEL
ALT: 21 U/L (ref 0–35)
Albumin: 4.1 g/dL (ref 3.5–5.2)
Alkaline Phosphatase: 52 U/L (ref 39–117)
Bilirubin, Direct: 0 mg/dL (ref 0.0–0.3)
Total Protein: 6.8 g/dL (ref 6.0–8.3)

## 2010-11-23 LAB — LIPID PANEL
Cholesterol: 117 mg/dL (ref 0–200)
HDL: 45.1 mg/dL
LDL Cholesterol: 43 mg/dL (ref 0–99)
Total CHOL/HDL Ratio: 3
Triglycerides: 146 mg/dL (ref 0.0–149.0)
VLDL: 29.2 mg/dL (ref 0.0–40.0)

## 2010-11-23 NOTE — Progress Notes (Signed)
Labs only

## 2010-12-20 ENCOUNTER — Other Ambulatory Visit: Payer: Self-pay

## 2010-12-20 MED ORDER — SIMVASTATIN 40 MG PO TABS: 40.0000 mg | ORAL_TABLET | Freq: Every day | ORAL | Status: DC

## 2010-12-20 MED ORDER — METOPROLOL TARTRATE 50 MG PO TABS: ORAL_TABLET | ORAL | Status: DC

## 2010-12-20 NOTE — Telephone Encounter (Signed)
Discussed with patient and she wanted her Rx for Metoprolol and Simvastatin to be sent to [facility]---Rx sent    KP

## 2011-08-06 ENCOUNTER — Other Ambulatory Visit: Payer: Self-pay | Admitting: Family Medicine

## 2011-09-10 ENCOUNTER — Other Ambulatory Visit: Payer: Self-pay | Admitting: *Deleted

## 2011-09-10 ENCOUNTER — Encounter (INDEPENDENT_AMBULATORY_CARE_PROVIDER_SITE_OTHER): Payer: BC Managed Care – PPO

## 2011-09-10 ENCOUNTER — Encounter: Payer: Self-pay | Admitting: Family Medicine

## 2011-09-10 ENCOUNTER — Ambulatory Visit (INDEPENDENT_AMBULATORY_CARE_PROVIDER_SITE_OTHER): Payer: BC Managed Care – PPO | Admitting: Family Medicine

## 2011-09-10 VITALS — BP 132/80 | HR 58 | Temp 98.8°F | Wt 155.0 lb

## 2011-09-10 DIAGNOSIS — M79603 Pain in arm, unspecified: Secondary | ICD-10-CM

## 2011-09-10 DIAGNOSIS — M79609 Pain in unspecified limb: Secondary | ICD-10-CM

## 2011-09-10 MED ORDER — CEPHALEXIN 500 MG PO CAPS: 500.0000 mg | ORAL_CAPSULE | Freq: Four times a day (QID) | ORAL | Status: AC

## 2011-09-10 NOTE — Patient Instructions (Addendum)
Deep Vein Thrombosis A deep vein thrombosis (DVT) is a blood clot (thrombus) that develops in a deep vein. A DVT is a clot in the deep, larger veins of the leg, arm, or pelvis. These are more dangerous than clots that might form in veins on the surface of the body. Deep vein thrombosis can lead to complications if the clot breaks off and travels in the bloodstream to the lungs. CAUSES Blood clots form in a vein for different reasons. Usually several things cause blood clots. They include:  The flow of blood slows down.   The inside of the vein is damaged in some way.   The person has a condition that makes blood clot more easily. These conditions may include:   Older age (especially over 57 years old).   Having a history of blood clots.   Having major or lengthy surgery. Hip surgery is particularly high-risk.   Breaking a hip or leg.   Sitting or lying still for a long time.   Cancer or cancer treatment.   Having a long, thin tube (catheter) placed inside a vein during a medical procedure.   Being overweight (obese).   Pregnancy and childbirth.   Medicines with estrogen.   Smoking.   Other circulation or heart problems.  SYMPTOMS When a clot forms, it can either partially or totally block the blood flow in that vein. Symptoms of a DVT can include:  Swelling of the leg or arm, especially if one side is much worse.   Warmth and redness of the leg or arm, especially if one side is much worse.   Pain in an arm or leg. If the clot is in the leg, symptoms may be more noticeable or worse when standing or walking.  If the blood clot travels to the lung, it may cause:  Shortness of breath.   Chest pain. The pain may be worsened by deep breaths.   Coughing up thick mucus (phlegm), possibly flecked with blood.  Anyone with these symptoms should get emergency medical treatment right away. Call your local emergency services (911 in [place] if you have these symptoms. DIAGNOSIS If  a DVT is suspected, your caregiver will take a full medical history. He or she will also perform a physical exam. Tests that also may be required include:  Studies of the clotting properties of the blood.   An ultrasound scan.   X-rays to show the flow of blood when special dye is injected into the veins (venography).   Studies of your lungs if you have any chest symptoms.  PREVENTION  Exercise the legs regularly. Take a brisk 30 minute walk every day.   Maintain a weight that is appropriate for your height.   Avoid sitting or lying in bed for long periods of time without moving your legs.   Women, particularly those over the age of 62, should consider the risks and benefits of taking estrogen medicines, including birth control pills.   Do not smoke, especially if you take estrogen medicines.   Long-distance travel can increase your risk. You should exercise your legs by walking or pumping the muscles every hour.   In hospital prevention:   Prevention may include medical and nonmedical measures.  TREATMENT  The most common treatment for DVT is blood thinning (anticoagulant) medicine, which reduces the blood's tendency to clot. Anticoagulants can stop new blood clots from forming and old ones from growing. They cannot dissolve existing clots. Your body does this by itself over time.  Anticoagulants can be given by mouth, by intravenous (IV) access, or by injection. Your caregiver will determine the best program for you.   Less commonly, clot-dissolving drugs (thrombolytics) are used to dissolve a DVT. They carry a high risk of bleeding, so they are used mainly in severe cases.   Very rarely, a blood clot in the leg needs to be removed surgically.   If you are unable to take anticoagulants, your caregiver may arrange for you to have a filter placed in a main vein in your belly (abdomen). This filter prevents clots from traveling to your lungs.  HOME CARE INSTRUCTIONS  Take all  medicines prescribed by your caregiver. Follow the directions carefully.   You will most likely continue taking anticoagulants after you leave the hospital. Your caregiver will advise you on the length of treatment (usually 3 to 6 months, sometimes for life).   Taking too much or too little of an anticoagulant is dangerous. While taking this type of medicine, you will need to have regular blood tests to be sure the dose is correct. The dose can change for many reasons. It is critically important that you take this medicine exactly as prescribed, and that you have blood tests exactly as directed.   Many foods can interfere with anticoagulants. These include foods high in vitamin K, such as spinach, kale, broccoli, cabbage, collard and turnip greens, Brussels sprouts, peas, cauliflower, seaweed, parsley, beef and pork liver, green tea, and soybean oil. Your caregiver should discuss limits on these foods with you or you should arrange a visit with a dietician to answer your questions.   Many medicines can interfere with anticoagulants. You must tell your caregiver about any and all medicines you take. This includes all vitamins and supplements. Be especially cautious with aspirin and anti-inflammatory medicines. Ask your caregiver before taking these.   Anticoagulants can have side effects, mostly excessive bruising or bleeding. You will need to hold pressure over cuts for longer than usual. Avoid alcoholic drinks or consume only very small amounts while taking this medicine.   If you are taking an anticoagulant:   Wear a medical alert bracelet.   Notify your dentist or other caregivers before procedures.   Avoid contact sports.   Ask your caregiver how soon you can go back to normal activities. Not being active can lead to new clots. Ask for a list of what you should and should not do.   Exercise your lower leg muscles. This is important while traveling.   You may need to wear compression  stockings. These are tight elastic stockings that apply pressure to the lower legs. This can help keep the blood in the legs from clotting.   If you are a smoker, you should quit.   Learn as much as you can about DVT.  SEEK MEDICAL CARE IF:  You have unusual bruising or any bleeding problems.   The swelling or pain in your affected arm or leg is not gradually improving.   You anticipate surgery or long-distance travel. You should get specific advice on DVT prevention.   You discover other family members with blood clots. This may require further testing for inherited diseases or conditions.  SEEK IMMEDIATE MEDICAL CARE IF:  You develop chest pain.   You develop severe shortness of breath.   You begin to cough up bloody mucus or phlegm (sputum).   You feel dizzy or faint.   You develop swelling or pain in the leg.   You have  breathing problems after traveling.  MAKE SURE YOU:  Understand these instructions.   Will watch your condition.   Will get help right away if you are not doing well or get worse.  Document Released: 01/07/2005 Document Revised: 12/27/2010 Document Reviewed: 03/01/2010 [facility] Patient Information 2012 [place].

## 2011-09-10 NOTE — Progress Notes (Signed)
  Subjective:    Patient ID: Tiffany Graham, female    DOB: 1950/06/17, 61 y.o.   MRN: [id number]  HPI Pt here c/o lump under R arm with pain that no goes to elbow.  No other symptoms.     Review of Systems As above    Objective:   Physical Exam  Constitutional: She is oriented to person, place, and time. She appears well-developed and well-nourished.  Musculoskeletal: She exhibits no edema and no tenderness.  Neurological: She is alert and oriented to person, place, and time.  Skin:       + ropiness R axilla to elbow, warm to touch an tender  Psychiatric: She has a normal mood and affect.          Assessment & Plan:

## 2011-09-10 NOTE — Assessment & Plan Note (Signed)
R/o DVT vs cellulitis abx sent to pharmacy [place] upper ext today

## 2011-09-13 ENCOUNTER — Encounter: Payer: Self-pay | Admitting: Family Medicine

## 2011-09-13 ENCOUNTER — Ambulatory Visit (INDEPENDENT_AMBULATORY_CARE_PROVIDER_SITE_OTHER): Payer: BC Managed Care – PPO | Admitting: Family Medicine

## 2011-09-13 VITALS — BP 126/74 | HR 57 | Temp 98.2°F | Wt 150.2 lb

## 2011-09-13 DIAGNOSIS — M79609 Pain in unspecified limb: Secondary | ICD-10-CM

## 2011-09-13 DIAGNOSIS — M79603 Pain in arm, unspecified: Secondary | ICD-10-CM

## 2011-09-14 ENCOUNTER — Encounter: Payer: Self-pay | Admitting: Family Medicine

## 2011-09-14 NOTE — Assessment & Plan Note (Signed)
Pain no better but errythema and swelling improved Finish abx Call Monday if no better and we will refer to ortho Ice/ heat

## 2011-09-14 NOTE — Patient Instructions (Signed)
Finish abx Alternate ice/ heat Call Monday if no better and we will refer to ortho

## 2011-09-14 NOTE — Progress Notes (Signed)
  Subjective:    Patient ID: Tiffany Graham, female    DOB: [birth year], 61 y.o.   MRN: [id number]  HPI Pt here f/u pain r arm.  [place] neg.  Pt still on abx and states it is no better.   Review of Systems As above    Objective:   Physical Exam  Constitutional: She is oriented to person, place, and time. She appears well-developed and well-nourished.  Musculoskeletal: She exhibits tenderness. She exhibits no edema.       Tenderness upper R arm--axilla to elbow No longer hot to touch and errythema resolved  Neurological: She is alert and oriented to person, place, and time.  Psychiatric: She has a normal mood and affect. Her behavior is normal. Judgment and thought content normal.          Assessment & Plan:

## 2011-09-16 ENCOUNTER — Ambulatory Visit: Payer: BC Managed Care – PPO | Admitting: Family Medicine

## 2011-10-29 ENCOUNTER — Other Ambulatory Visit: Payer: Self-pay | Admitting: Family Medicine

## 2011-11-04 ENCOUNTER — Ambulatory Visit (INDEPENDENT_AMBULATORY_CARE_PROVIDER_SITE_OTHER): Payer: BC Managed Care – PPO | Admitting: Family Medicine

## 2011-11-04 ENCOUNTER — Encounter: Payer: Self-pay | Admitting: Family Medicine

## 2011-11-04 ENCOUNTER — Telehealth: Payer: Self-pay | Admitting: Family Medicine

## 2011-11-04 VITALS — BP 126/84 | HR 59 | Temp 98.5°F | Ht 65.25 in | Wt 154.0 lb

## 2011-11-04 DIAGNOSIS — R300 Dysuria: Secondary | ICD-10-CM

## 2011-11-04 LAB — POCT URINALYSIS DIPSTICK
Glucose, UA: NEGATIVE
Ketones, UA: NEGATIVE
Spec Grav, UA: 1.01

## 2011-11-04 MED ORDER — CIPROFLOXACIN HCL 500 MG PO TABS: 500.0000 mg | ORAL_TABLET | Freq: Two times a day (BID) | ORAL | Status: DC

## 2011-11-04 NOTE — Progress Notes (Signed)
  Subjective:    Tiffany Graham is a 61 y.o. female who complains of dysuria and frequency. She has had symptoms for few days. Patient also complains of nothing. Patient denies fever , abd pain. Patient does not have a history of recurrent UTI. Patient does not have a history of pyelonephritis.     Review of Systems As above   Objective:     Filed Vitals:   11/04/11 1619  BP: 126/84  Pulse: 59  Temp: 98.5 F (36.9 C)  TempSrc: Oral  Height: 5' 5.25" (1.657 m)  Weight: 154 lb (69.854 kg)  SpO2: 98%  gen==AAOx3  nad abd --- soft , nt No flank pain  Laboratory:  Urine dipstick: tr protein, mod blood,  +1 leuk Micro exam: not done   Assessment:    dysuria   Plan:    check culture abx rto prn

## 2011-11-04 NOTE — Telephone Encounter (Signed)
Caller: [name]/Patient; Patient Name: Tiffany Graham; PCP: Rosalita Chessman.; Best Callback Phone Number: [phone] Started with frequency and burning with urination- onset 11/03/11. Today she is voiding small frequent amounts. Afebrile. Triage and Care advice per Urinary Symptoms Protocol and appointment scheduled for 1600 11/04/11 with Dr. Etter Sjogren

## 2011-11-04 NOTE — Patient Instructions (Signed)
Urinary Tract Infection Urinary tract infections (UTIs) can develop anywhere along your urinary tract. Your urinary tract is your body's drainage system for removing wastes and extra water. Your urinary tract includes two kidneys, two ureters, a bladder, and a urethra. Your kidneys are a pair of bean-shaped organs. Each kidney is about the size of your fist. They are located below your ribs, one on each side of your spine. CAUSES Infections are caused by microbes, which are microscopic organisms, including fungi, viruses, and bacteria. These organisms are so small that they can only be seen through a microscope. Bacteria are the microbes that most commonly cause UTIs. SYMPTOMS  Symptoms of UTIs may vary by age and gender of the patient and by the location of the infection. Symptoms in young women typically include a frequent and intense urge to urinate and a painful, burning feeling in the bladder or urethra during urination. Older women and men are more likely to be tired, shaky, and weak and have muscle aches and abdominal pain. A fever may mean the infection is in your kidneys. Other symptoms of a kidney infection include pain in your back or sides below the ribs, nausea, and vomiting. DIAGNOSIS To diagnose a UTI, your caregiver will ask you about your symptoms. Your caregiver also will ask to provide a urine sample. The urine sample will be tested for bacteria and white blood cells. White blood cells are made by your body to help fight infection. TREATMENT  Typically, UTIs can be treated with medication. Because most UTIs are caused by a bacterial infection, they usually can be treated with the use of antibiotics. The choice of antibiotic and length of treatment depend on your symptoms and the type of bacteria causing your infection. HOME CARE INSTRUCTIONS  If you were prescribed antibiotics, take them exactly as your caregiver instructs you. Finish the medication even if you feel better after you  have only taken some of the medication.  Drink enough water and fluids to keep your urine clear or pale yellow.  Avoid caffeine, tea, and carbonated beverages. They tend to irritate your bladder.  Empty your bladder often. Avoid holding urine for long periods of time.  Empty your bladder before and after sexual intercourse.  After a bowel movement, women should cleanse from front to back. Use each tissue only once. SEEK MEDICAL CARE IF:   You have back pain.  You develop a fever.  Your symptoms do not begin to resolve within 3 days. SEEK IMMEDIATE MEDICAL CARE IF:   You have severe back pain or lower abdominal pain.  You develop chills.  You have nausea or vomiting.  You have continued burning or discomfort with urination. MAKE SURE YOU:   Understand these instructions.  Will watch your condition.  Will get help right away if you are not doing well or get worse. Document Released: 10/17/2004 Document Revised: 07/09/2011 Document Reviewed: 02/15/2011 [facility] Patient Information 2013 [place].

## 2011-11-07 LAB — CULTURE, URINE COMPREHENSIVE: Colony Count: >=100000

## 2011-11-29 ENCOUNTER — Other Ambulatory Visit: Payer: Self-pay | Admitting: Family Medicine

## 2012-01-27 ENCOUNTER — Other Ambulatory Visit: Payer: Self-pay | Admitting: Family Medicine

## 2012-01-30 ENCOUNTER — Encounter: Payer: Self-pay | Admitting: Gastroenterology

## 2012-02-03 ENCOUNTER — Encounter: Payer: Self-pay | Admitting: Gastroenterology

## 2012-02-20 ENCOUNTER — Encounter: Payer: Self-pay | Admitting: Family Medicine

## 2012-02-20 ENCOUNTER — Ambulatory Visit (INDEPENDENT_AMBULATORY_CARE_PROVIDER_SITE_OTHER): Payer: BC Managed Care – PPO | Admitting: Family Medicine

## 2012-02-20 VITALS — BP 140/82 | HR 50 | Temp 98.7°F | Wt 154.6 lb

## 2012-02-20 DIAGNOSIS — I10 Essential (primary) hypertension: Secondary | ICD-10-CM

## 2012-02-20 DIAGNOSIS — J309 Allergic rhinitis, unspecified: Secondary | ICD-10-CM

## 2012-02-20 DIAGNOSIS — J302 Other seasonal allergic rhinitis: Secondary | ICD-10-CM

## 2012-02-20 DIAGNOSIS — E785 Hyperlipidemia, unspecified: Secondary | ICD-10-CM

## 2012-02-20 LAB — HEPATIC FUNCTION PANEL
Alkaline Phosphatase: 55 U/L (ref 39–117)
Bilirubin, Direct: 0.1 mg/dL (ref 0.0–0.3)
Total Bilirubin: 0.7 mg/dL (ref 0.3–1.2)

## 2012-02-20 LAB — BASIC METABOLIC PANEL
BUN: 15 mg/dL (ref 6–23)
Calcium: 9.4 mg/dL (ref 8.4–10.5)
GFR: 77.94 mL/min (ref >60.00)
Glucose, Bld: 90 mg/dL (ref 70–99)
Potassium: 3.8 mEq/L (ref 3.5–5.1)
Sodium: 141 mEq/L (ref 135–145)

## 2012-02-20 LAB — LIPID PANEL
HDL: 44.9 mg/dL (ref >39.00)
LDL Cholesterol: 49 mg/dL (ref 0–99)
Total CHOL/HDL Ratio: 3
Triglycerides: 129 mg/dL (ref 0.0–149.0)

## 2012-02-20 MED ORDER — OMEPRAZOLE 20 MG PO CPDR: 20.0000 mg | DELAYED_RELEASE_CAPSULE | Freq: Every day | ORAL | Status: DC

## 2012-02-20 MED ORDER — METOPROLOL TARTRATE 50 MG PO TABS: ORAL_TABLET | ORAL | Status: DC

## 2012-02-20 MED ORDER — FLUTICASONE PROPIONATE 50 MCG/ACT NA SUSP
2.0000 | Freq: Every day | NASAL | Status: DC
Start: 2012-02-20 — End: 2012-06-11

## 2012-02-20 NOTE — Progress Notes (Signed)
  Subjective:    Patient here for follow-up of elevated blood pressure and cholesterol.    She  is adherent to a low-salt diet.  Blood pressure is well controlled at home. Cardiac symptoms: none. Patient denies: chest pain, chest pressure/discomfort, claudication, dyspnea, exertional chest pressure/discomfort, fatigue, irregular heart beat, lower extremity edema, near-syncope, orthopnea, palpitations, paroxysmal nocturnal dyspnea, syncope and tachypnea. Cardiovascular risk factors: dyslipidemia and hypertension. Use of agents associated with hypertension: none. History of target organ damage: none.  The following portions of the patient's history were reviewed and updated as appropriate: allergies, current medications, past family history, past medical history, past social history, past surgical history and problem list.  Review of Systems Pertinent items are noted in HPI.     Objective:    BP 140/82  Pulse 50  Temp 98.7 F (37.1 C) (Oral)  Wt 154 lb 9.6 oz (70.126 kg)  SpO2 98% General appearance: alert, cooperative, appears stated age and no distress Lungs: clear to auscultation bilaterally Heart: S1, S2 normal Extremities: extremities normal, atraumatic, no cyanosis or edema    Assessment:    Hypertension, normal blood pressure . Evidence of target organ damage: none.   hyperlipidemia-- check labs Plan:    Medication: no change. Dietary sodium restriction. Regular aerobic exercise. Check blood pressures 2-3 times weekly and record. Follow up: 6 weeks and as needed.

## 2012-02-20 NOTE — Patient Instructions (Signed)

## 2012-02-21 LAB — POCT URINALYSIS DIPSTICK
Blood, UA: NEGATIVE
Leukocytes, UA: NEGATIVE
Nitrite, UA: NEGATIVE
Protein, UA: NEGATIVE
Urobilinogen, UA: 0.2
pH, UA: 6

## 2012-03-02 ENCOUNTER — Ambulatory Visit (AMBULATORY_SURGERY_CENTER): Payer: BC Managed Care – PPO | Admitting: *Deleted

## 2012-03-02 ENCOUNTER — Encounter: Payer: Self-pay | Admitting: Gastroenterology

## 2012-03-02 VITALS — Ht 65.0 in | Wt 151.0 lb

## 2012-03-02 DIAGNOSIS — Z1211 Encounter for screening for malignant neoplasm of colon: Secondary | ICD-10-CM

## 2012-03-02 DIAGNOSIS — Z800 Family history of malignant neoplasm of digestive organs: Secondary | ICD-10-CM

## 2012-03-02 MED ORDER — MOVIPREP 100 G PO SOLR: ORAL | Status: DC

## 2012-03-09 ENCOUNTER — Telehealth: Payer: Self-pay | Admitting: Family Medicine

## 2012-03-09 NOTE — Telephone Encounter (Signed)
LM On triage line 11:30am  Would like lab results cb# [phone]

## 2012-03-09 NOTE — Telephone Encounter (Signed)
Detailed msg left advising the labs were released to my-chart and she can log in to review        KP

## 2012-03-16 ENCOUNTER — Ambulatory Visit (AMBULATORY_SURGERY_CENTER): Payer: BC Managed Care – PPO | Admitting: Gastroenterology

## 2012-03-16 ENCOUNTER — Encounter: Payer: Self-pay | Admitting: Gastroenterology

## 2012-03-16 VITALS — BP 133/70 | HR 56 | Temp 98.4°F | Resp 18 | Ht 65.0 in | Wt 151.0 lb

## 2012-03-16 DIAGNOSIS — Z800 Family history of malignant neoplasm of digestive organs: Secondary | ICD-10-CM

## 2012-03-16 DIAGNOSIS — Z1211 Encounter for screening for malignant neoplasm of colon: Secondary | ICD-10-CM

## 2012-03-16 DIAGNOSIS — K5730 Diverticulosis of large intestine without perforation or abscess without bleeding: Secondary | ICD-10-CM

## 2012-03-16 MED ORDER — SODIUM CHLORIDE 0.9 % IV SOLN: 500.0000 mL | INTRAVENOUS | Status: DC

## 2012-03-16 NOTE — Patient Instructions (Addendum)
YOU HAD AN ENDOSCOPIC PROCEDURE TODAY AT THE [facility]: Refer to the procedure report that was given to you for any specific questions about what was found during the examination.  If the procedure report does not answer your questions, please call your gastroenterologist to clarify.  If you requested that your care partner not be given the details of your procedure findings, then the procedure report has been included in a sealed envelope for you to review at your convenience later.  YOU SHOULD EXPECT: Some feelings of bloating in the abdomen. Passage of more gas than usual.  Walking can help get rid of the air that was put into your GI tract during the procedure and reduce the bloating. If you had a lower endoscopy (such as a colonoscopy or flexible sigmoidoscopy) you may notice spotting of blood in your stool or on the toilet paper. If you underwent a bowel prep for your procedure, then you may not have a normal bowel movement for a few days.  DIET: Your first meal following the procedure should be a light meal and then it is ok to progress to your normal diet.  A half-sandwich or bowl of soup is an example of a good first meal.  Heavy or fried foods are harder to digest and may make you feel nauseous or bloated.  Likewise meals heavy in dairy and vegetables can cause extra gas to form and this can also increase the bloating.  Drink plenty of fluids but you should avoid alcoholic beverages for 24 hours.  ACTIVITY: Your care partner should take you home directly after the procedure.  You should plan to take it easy, moving slowly for the rest of the day.  You can resume normal activity the day after the procedure however you should NOT DRIVE or use heavy machinery for 24 hours (because of the sedation medicines used during the test).    SYMPTOMS TO REPORT IMMEDIATELY: A gastroenterologist can be reached at any hour.  During normal business hours, 8:30 AM to 5:00 PM Monday through Friday,  call [phone].  After hours and on weekends, please call the GI answering service at [phone] who will take a message and have the physician on call contact you.   Following lower endoscopy (colonoscopy or flexible sigmoidoscopy):  Excessive amounts of blood in the stool  Significant tenderness or worsening of abdominal pains  Swelling of the abdomen that is new, acute  Fever of 100F or higher  Following upper endoscopy (EGD)  Vomiting of blood or coffee ground material  New chest pain or pain under the shoulder blades  Painful or persistently difficult swallowing  New shortness of breath  Fever of 100F or higher  Black, tarry-looking stools  FOLLOW UP: If any biopsies were taken you will be contacted by phone or by letter within the next 1-3 weeks.  Call your gastroenterologist if you have not heard about the biopsies in 3 weeks.  Our staff will call the home number listed on your records the next business day following your procedure to check on you and address any questions or concerns that you may have at that time regarding the information given to you following your procedure. This is a courtesy call and so if there is no answer at the home number and we have not heard from you through the emergency physician on call, we will assume that you have returned to your regular daily activities without incident.  SIGNATURES/CONFIDENTIALITY: You and/or your care   partner have signed paperwork which will be entered into your electronic medical record.  These signatures attest to the fact that that the information above on your After Visit Summary has been reviewed and is understood.  Full responsibility of the confidentiality of this discharge information lies with you and/or your care-partner.  

## 2012-03-16 NOTE — Progress Notes (Signed)
Patient did not experience any of the following events: a burn prior to discharge; a fall within the facility; wrong site/side/patient/procedure/implant event; or a hospital transfer or hospital admission upon discharge from the facility. (G8907) Patient did not have preoperative order for IV antibiotic SSI prophylaxis. (G8918)  

## 2012-03-16 NOTE — Op Note (Signed)
[place]  [address] [place], [address]   COLONOSCOPY PROCEDURE REPORT  PATIENT: Tiffany Graham, Tiffany Graham  MR#: [id number] BIRTHDATE: 10/28/1950 , 5  yrs. old GENDER: Female ENDOSCOPIST: Sable Feil, MD, [facility] REFERRED BY: PROCEDURE DATE:  03/16/2012 PROCEDURE:   Colonoscopy, screening ASA CLASS:   Class II INDICATIONS:Patient's immediate family history of colon cancer. MEDICATIONS: propofol (Diprivan) 244m IV  DESCRIPTION OF PROCEDURE:   After the risks and benefits and of the procedure were explained, informed consent was obtained.        The LB [id number]-[id number] endoscope was introduced through the anus and advanced to the cecum, which was identified by both the appendix and ileocecal valve .  The quality of the prep was excellent, using MoviPrep .  The instrument was then slowly withdrawn as the colon was fully examined.     COLON FINDINGS: Moderate diverticulosis was noted in the descending colon and sigmoid colon.   The colon mucosa was otherwise normal. The colon was otherwise normal.  There was no diverticulosis, inflammation, polyps or cancers unless previously stated. Retroflexed views revealed no abnormalities.     The scope was then withdrawn from the patient and the procedure completed.  COMPLICATIONS: There were no complications. ENDOSCOPIC IMPRESSION: 1.   Moderate diverticulosis was noted in the descending colon and sigmoid colon 2.   The colon mucosa was otherwise normal ...no polyps noted   RECOMMENDATIONS: 1.  High fiber diet 2.  Given your significant family history of colon cancer, you should have a repeat colonoscopy in 5 years   REPEAT EXAM:  cMA:UQJFHLR Lowne, DO  _______________________________ eSigned:Sable Feil MD, [facility]02/24/2014 11:18 AM

## 2012-03-17 ENCOUNTER — Telehealth: Payer: Self-pay | Admitting: *Deleted

## 2012-03-17 NOTE — Telephone Encounter (Signed)
  Follow up Call-  Call back number 03/16/2012  Post procedure Call Back phone  # [phone]  Permission to leave phone message Yes     Patient questions:  Message left to call us if necessary.

## 2012-04-14 ENCOUNTER — Telehealth: Payer: Self-pay | Admitting: Gastroenterology

## 2012-04-14 ENCOUNTER — Other Ambulatory Visit: Payer: Self-pay | Admitting: *Deleted

## 2012-04-14 DIAGNOSIS — E7800 Pure hypercholesterolemia, unspecified: Secondary | ICD-10-CM

## 2012-04-14 MED ORDER — SIMVASTATIN 40 MG PO TABS: ORAL_TABLET | ORAL | Status: DC

## 2012-04-14 NOTE — Telephone Encounter (Signed)
Refill for simvastatin sent to [facility]

## 2012-04-15 NOTE — Telephone Encounter (Signed)
Pt had her COLON on 03/16/12 and [place] would not pay for Propofol; she did not find this out until after the fact and she has to pay >$500. Pt's husband is due for his recall COLON in September, 2014; can they request moderate sedation and not Propofol? Thanks.

## 2012-04-15 NOTE — Telephone Encounter (Signed)
ok 

## 2012-04-15 NOTE — Telephone Encounter (Signed)
Informed pt that Dr Sharlett Iles will do moderate sedation on her husband. I made a note on the recall.  Pt wants to know if we can do anything about her bill and I told her we cannot guarantee her bill will be paid with [employer]. She states she spoke with [employer] and they stated we can refile and they may pay. [name], can you help? Tiffany Graham [phone]

## 2012-06-11 ENCOUNTER — Ambulatory Visit (INDEPENDENT_AMBULATORY_CARE_PROVIDER_SITE_OTHER): Payer: BC Managed Care – PPO | Admitting: Family Medicine

## 2012-06-11 ENCOUNTER — Encounter: Payer: Self-pay | Admitting: Family Medicine

## 2012-06-11 VITALS — BP 122/82 | HR 83 | Temp 99.3°F | Wt 151.8 lb

## 2012-06-11 DIAGNOSIS — L247 Irritant contact dermatitis due to plants, except food: Secondary | ICD-10-CM

## 2012-06-11 DIAGNOSIS — L255 Unspecified contact dermatitis due to plants, except food: Secondary | ICD-10-CM

## 2012-06-11 MED ORDER — METHYLPREDNISOLONE ACETATE 80 MG/ML IJ SUSP
80.0000 mg | Freq: Once | INTRAMUSCULAR | Status: AC
  Administered 2012-06-11: 80 mg via INTRAMUSCULAR

## 2012-06-11 MED ORDER — MOMETASONE FUROATE 0.1 % EX CREA: TOPICAL_CREAM | Freq: Every day | CUTANEOUS | Status: DC

## 2012-06-11 NOTE — Progress Notes (Signed)
  Subjective:     Tiffany Graham is a 62 y.o. female who presents for evaluation of a rash involving the face, forearm and upper extremity. Rash started several days ago. Lesions are pink, and blistering in texture. Rash has changed over time. Rash is pruritic. Associated symptoms: none. Patient denies: abdominal pain, arthralgia, congestion, cough, crankiness, decrease in appetite, decrease in energy level, fever, headache, irritability, myalgia, nausea, sore throat and vomiting. Patient has not had contacts with similar rash. Patient has had new exposures (soaps, lotions, laundry detergents, foods, medications, plants, insects or animals).  The following portions of the patient's history were reviewed and updated as appropriate: allergies, current medications, past family history, past medical history, past social history, past surgical history and problem list.  Review of Systems Pertinent items are noted in HPI.    Objective:    BP 122/82  Pulse 83  Temp(Src) 99.3 F (37.4 C) (Oral)  Wt 151 lb 12.8 oz (68.856 kg)  BMI 25.26 kg/m2  SpO2 99% General:  alert, cooperative, appears stated age and no distress  Skin:  vesicles noted on extremities and face     Assessment:    contact dermatitis: plants poison ivy    Plan:    Medications: topical steroid: elocon. Written patient instruction given. Follow up in prn -.  Pt given depo medrol IM She did not want pred pack Antihistamine for itching

## 2012-06-11 NOTE — Patient Instructions (Signed)
Poison Ivy Poison ivy is a inflammation of the skin (contact dermatitis) caused by touching the allergens on the leaves of the ivy plant following previous exposure to the plant. The rash usually appears 48 hours after exposure. The rash is usually bumps (papules) or blisters (vesicles) in a linear pattern. Depending on your own sensitivity, the rash may simply cause redness and itching, or it may also progress to blisters which may break open. These must be well cared for to prevent secondary bacterial (germ) infection, followed by scarring. Keep any open areas dry, clean, dressed, and covered with an antibacterial ointment if needed. The eyes may also get puffy. The puffiness is worst in the morning and gets better as the day progresses. This dermatitis usually heals without scarring, within 2 to 3 weeks without treatment. HOME CARE INSTRUCTIONS  Thoroughly wash with soap and water as soon as you have been exposed to poison ivy. You have about one half hour to remove the plant resin before it will cause the rash. This washing will destroy the oil or antigen on the skin that is causing, or will cause, the rash. Be sure to wash under your fingernails as any plant resin there will continue to spread the rash. Do not rub skin vigorously when washing affected area. Poison ivy cannot spread if no oil from the plant remains on your body. A rash that has progressed to weeping sores will not spread the rash unless you have not washed thoroughly. It is also important to wash any clothes you have been wearing as these may carry active allergens. The rash will return if you wear the unwashed clothing, even several days later. Avoidance of the plant in the future is the best measure. Poison ivy plant can be recognized by the number of leaves. Generally, poison ivy has three leaves with flowering branches on a single stem. Diphenhydramine may be purchased over the counter and used as needed for itching. Do not drive with  this medication if it makes you drowsy.Ask your caregiver about medication for children. SEEK MEDICAL CARE IF:  Open sores develop.  Redness spreads beyond area of rash.  You notice purulent (pus-like) discharge.  You have increased pain.  Other signs of infection develop (such as fever). Document Released: 01/05/2000 Document Revised: 04/01/2011 Document Reviewed: 11/23/2008 ExitCare Patient Information 2014 [employer].  

## 2012-07-30 ENCOUNTER — Ambulatory Visit (INDEPENDENT_AMBULATORY_CARE_PROVIDER_SITE_OTHER): Payer: BC Managed Care – PPO | Admitting: Internal Medicine

## 2012-07-30 ENCOUNTER — Encounter: Payer: Self-pay | Admitting: Internal Medicine

## 2012-07-30 VITALS — BP 130/82 | HR 72 | Temp 98.3°F | Wt 152.0 lb

## 2012-07-30 DIAGNOSIS — H6980 Other specified disorders of Eustachian tube, unspecified ear: Secondary | ICD-10-CM

## 2012-07-30 DIAGNOSIS — H6990 Unspecified Eustachian tube disorder, unspecified ear: Secondary | ICD-10-CM

## 2012-07-30 DIAGNOSIS — H6981 Other specified disorders of Eustachian tube, right ear: Secondary | ICD-10-CM

## 2012-07-30 NOTE — Patient Instructions (Addendum)
Nasonex 1 spray in R nostril twice a day as needed. Use the "crossover" technique as discussed. Go to [facility] for eustachian tube dysfunction. Drink thin  fluids liberally through the day and chew sugarless gum . Do the Valsalva maneuver several times a day to "pop" ears open.

## 2012-07-30 NOTE — Progress Notes (Signed)
  Subjective:    Patient ID: Tiffany Graham, female    DOB: 1951-01-13, 62 y.o.   MRN: [id number]  HPI   She began to have in the right ear 7/8 ; she had [place] since 7/3. There is no associated discharge. It is constant and up to 3/10, described as dull , burning , warm pain . Associated hearing loss. Tylenol helps.She has had chills , sweats, ST, & NP cough.  She noted some matting of her eyes this morning    Review of Systems  There's been no associated frontal sinus pain, facial pain, nasal purulence, or dental pain. No chest congestion, SOB, or wheezing     Objective:   Physical Exam   General appearance:good health ;well nourished; no acute distress or increased work of breathing is present.  No  lymphadenopathy about the head, neck, or axilla noted.   Eyes: No conjunctival inflammation or lid edema is present. EOMI ; vision intact  Ears:  External ear exam shows no significant lesions or deformities.  Otoscopic examination reveals clear canals, tympanic membranes are intact bilaterally without bulging, retraction, inflammation or discharge. Whisper heard bilaterally  Nose:  External nasal examination shows no deformity or inflammation. Nasal mucosa are pink and moist without lesions or exudates. No septal dislocation or deviation.No obstruction to airflow.   Oral exam: Dental hygiene is good; lips and gums are healthy appearing.There is no oropharyngeal erythema or exudate noted.   Neck:  No deformities, masses, or tenderness noted.     Heart:  Normal rate and regular rhythm. S1 and S2 normal without gallop, murmur, click, rub or other extra sounds.   Lungs:Chest clear to auscultation; no wheezes, rhonchi,rales ,or rubs present.No increased work of breathing.    Extremities:  No cyanosis, edema, or clubbing  noted    Skin: Warm & dry          Assessment & Plan:  #1 eustachian tube dysfunction in the context of fresh water swimming. No  evidence of otitis media or rhinosinusitis  Plan: See orders and recommendations

## 2012-10-21 LAB — HM PAP SMEAR

## 2012-10-21 LAB — HM MAMMOGRAPHY

## 2012-10-21 LAB — HM DEXA SCAN

## 2012-11-03 ENCOUNTER — Telehealth: Payer: Self-pay | Admitting: *Deleted

## 2012-11-03 ENCOUNTER — Other Ambulatory Visit: Payer: Self-pay | Admitting: Family Medicine

## 2012-11-03 DIAGNOSIS — Z0182 Encounter for allergy testing: Secondary | ICD-10-CM

## 2012-11-03 NOTE — Telephone Encounter (Signed)
Pt notified referral was placed.

## 2012-11-03 NOTE — Telephone Encounter (Signed)
She would need referral to allergy for skin testing

## 2012-11-03 NOTE — Telephone Encounter (Signed)
Pt called and requested an allergy testing for shellfish.Pt states that she was eating lobster and believes she had a reaction to it. Please advise. SW

## 2012-11-26 ENCOUNTER — Other Ambulatory Visit: Payer: Self-pay

## 2013-02-10 ENCOUNTER — Ambulatory Visit (INDEPENDENT_AMBULATORY_CARE_PROVIDER_SITE_OTHER): Payer: BC Managed Care – PPO | Admitting: *Deleted

## 2013-02-10 DIAGNOSIS — Z23 Encounter for immunization: Secondary | ICD-10-CM

## 2013-03-24 ENCOUNTER — Other Ambulatory Visit: Payer: Self-pay | Admitting: Family Medicine

## 2013-03-24 NOTE — Telephone Encounter (Signed)
Metoprolol and Simvastatin refilled.  Script sent to [facility].  One month supply sent due to patient last office visit 07/30/12 and last hepatic and lipid panel drawn on 02/20/12.  Patient notified.  Follow-up appt scheduled for 3/19 at 0900.

## 2013-04-08 ENCOUNTER — Encounter: Payer: Self-pay | Admitting: Family Medicine

## 2013-04-08 ENCOUNTER — Ambulatory Visit (INDEPENDENT_AMBULATORY_CARE_PROVIDER_SITE_OTHER): Payer: BC Managed Care – PPO | Admitting: Family Medicine

## 2013-04-08 VITALS — BP 122/84 | HR 63 | Temp 98.5°F | Wt 145.2 lb

## 2013-04-08 DIAGNOSIS — E785 Hyperlipidemia, unspecified: Secondary | ICD-10-CM

## 2013-04-08 DIAGNOSIS — I10 Essential (primary) hypertension: Secondary | ICD-10-CM

## 2013-04-08 LAB — BASIC METABOLIC PANEL
BUN: 12 mg/dL (ref 6–23)
CHLORIDE: 106 meq/L (ref 96–112)
CO2: 26 meq/L (ref 19–32)
CREATININE: 0.7 mg/dL (ref 0.4–1.2)
Calcium: 9.2 mg/dL (ref 8.4–10.5)
GFR: 89.3 mL/min (ref >60.00)
Glucose, Bld: 88 mg/dL (ref 70–99)
Potassium: 3.8 mEq/L (ref 3.5–5.1)
Sodium: 142 mEq/L (ref 135–145)

## 2013-04-08 LAB — HEPATIC FUNCTION PANEL
ALT: 18 U/L (ref 0–35)
AST: 23 U/L (ref 0–37)
Albumin: 4.3 g/dL (ref 3.5–5.2)
Alkaline Phosphatase: 44 U/L (ref 39–117)
Bilirubin, Direct: 0.1 mg/dL (ref 0.0–0.3)
TOTAL PROTEIN: 6.9 g/dL (ref 6.0–8.3)
Total Bilirubin: 0.7 mg/dL (ref 0.3–1.2)

## 2013-04-08 LAB — LIPID PANEL
CHOL/HDL RATIO: 3
CHOLESTEROL: 135 mg/dL (ref 0–200)
HDL: 48.6 mg/dL (ref >39.00)
LDL Cholesterol: 55 mg/dL (ref 0–99)
Triglycerides: 156 mg/dL — ABNORMAL HIGH (ref 0.0–149.0)
VLDL: 31.2 mg/dL (ref 0.0–40.0)

## 2013-04-08 MED ORDER — METOPROLOL TARTRATE 50 MG PO TABS: ORAL_TABLET | ORAL | Status: DC

## 2013-04-08 NOTE — Patient Instructions (Signed)

## 2013-04-08 NOTE — Assessment & Plan Note (Signed)
Check labs con't meds 

## 2013-04-08 NOTE — Progress Notes (Signed)
  Subjective:    Patient here for follow-up of elevated blood pressure.  She is exercising and is adherent to a low-salt diet.  Blood pressure is well controlled at home. Cardiac symptoms: none. Patient denies: chest pain, chest pressure/discomfort, claudication, dyspnea, exertional chest pressure/discomfort, fatigue, irregular heart beat, lower extremity edema, near-syncope, orthopnea, palpitations, paroxysmal nocturnal dyspnea, syncope and tachypnea. Cardiovascular risk factors: dyslipidemia and hypertension. Use of agents associated with hypertension: none. History of target organ damage: none.  The following portions of the patient's history were reviewed and updated as appropriate: allergies, current medications, past family history, past medical history, past social history, past surgical history and problem list.  Review of Systems Pertinent items are noted in HPI.     Objective:    BP 122/84  Pulse 63  Temp(Src) 98.5 F (36.9 C) (Oral)  Wt 145 lb 3.2 oz (65.862 kg)  SpO2 98% General appearance: alert, cooperative, appears stated age and no distress Throat: lips, mucosa, and tongue normal; teeth and gums normal Neck: no adenopathy, supple, symmetrical, trachea midline and thyroid not enlarged, symmetric, no tenderness/mass/nodules Lungs: clear to auscultation bilaterally Heart: S1, S2 normal Extremities: extremities normal, atraumatic, no cyanosis or edema    Assessment:    Hypertension, normal blood pressure . Evidence of target organ damage: none.    Plan:    Medication: no change. Dietary sodium restriction. Regular aerobic exercise. Check blood pressures 2-3 times weekly and record. Follow up: 6 months and as needed.   Marland Kitchen

## 2013-04-08 NOTE — Progress Notes (Signed)
Pre visit review using our clinic review tool, if applicable. No additional management support is needed unless otherwise documented below in the visit note. 

## 2013-04-09 ENCOUNTER — Telehealth: Payer: Self-pay | Admitting: Family Medicine

## 2013-04-09 NOTE — Telephone Encounter (Signed)
Relevant patient education assigned to patient using Emmi. ° °

## 2013-06-10 ENCOUNTER — Telehealth: Payer: Self-pay | Admitting: Family Medicine

## 2013-06-10 MED ORDER — SIMVASTATIN 40 MG PO TABS: ORAL_TABLET | ORAL | Status: DC

## 2013-06-10 NOTE — Telephone Encounter (Signed)
Caller name:Tiffany Graham Relation to pt: patient Call back number: [phone] Pharmacy: [facility]   Reason for call: patient came in and requested a refill for Simvastatin 40 mg 90 day supply

## 2013-06-10 NOTE — Telephone Encounter (Signed)
Rx sent      KP 

## 2013-06-21 ENCOUNTER — Encounter: Payer: Self-pay | Admitting: Family Medicine

## 2013-06-21 ENCOUNTER — Telehealth: Payer: Self-pay | Admitting: Family Medicine

## 2013-06-21 ENCOUNTER — Other Ambulatory Visit: Payer: Self-pay | Admitting: Family Medicine

## 2013-06-21 ENCOUNTER — Ambulatory Visit (INDEPENDENT_AMBULATORY_CARE_PROVIDER_SITE_OTHER): Payer: BC Managed Care – PPO | Admitting: Family Medicine

## 2013-06-21 VITALS — BP 124/78 | HR 98 | Temp 98.1°F | Resp 16 | Wt 150.0 lb

## 2013-06-21 DIAGNOSIS — L259 Unspecified contact dermatitis, unspecified cause: Secondary | ICD-10-CM

## 2013-06-21 DIAGNOSIS — L255 Unspecified contact dermatitis due to plants, except food: Secondary | ICD-10-CM

## 2013-06-21 DIAGNOSIS — L237 Allergic contact dermatitis due to plants, except food: Secondary | ICD-10-CM

## 2013-06-21 MED ORDER — PREDNISONE 10 MG PO TABS: ORAL_TABLET | ORAL | Status: DC

## 2013-06-21 MED ORDER — METHYLPREDNISOLONE ACETATE 80 MG/ML IJ SUSP
80.0000 mg | Freq: Once | INTRAMUSCULAR | Status: AC
  Administered 2013-06-21: 80 mg via INTRAMUSCULAR

## 2013-06-21 NOTE — Telephone Encounter (Signed)
Patient aware Rx has been sent      KP

## 2013-06-21 NOTE — Telephone Encounter (Signed)
Patient called and stated that she thought Dr Etter Sjogren was going to send in a rx for poison ivy to her pharmacy. Also patient would like an Oral rx.

## 2013-06-21 NOTE — Patient Instructions (Signed)

## 2013-06-21 NOTE — Addendum Note (Signed)
Addended by: [name] on: 06/21/2013 02:11 PM   Modules accepted: Orders

## 2013-06-21 NOTE — Telephone Encounter (Signed)
Sent in

## 2013-06-21 NOTE — Progress Notes (Signed)
Pre-visit discussion using our clinic review tool, as applicable. No additional management support is needed unless otherwise documented below in the visit note.  

## 2013-06-21 NOTE — Progress Notes (Signed)
  Subjective:     Tiffany Graham is a 63 y.o. female who presents for evaluation of a rash involving the chest, face and forearm. Rash started a few weeks ago. Lesions are pink, and blistering in texture. Rash has changed over time. Rash is pruritic. Associated symptoms: none. Patient denies: abdominal pain, arthralgia, congestion, cough, crankiness, decrease in appetite, decrease in energy level, fever, headache, irritability, myalgia, nausea, sore throat and vomiting. Patient has not had contacts with similar rash. Patient has had new exposures (soaps, lotions, laundry detergents, foods, medications, plants, insects or animals).  The following portions of the patient's history were reviewed and updated as appropriate: allergies, current medications, past family history, past medical history, past social history, past surgical history and problem list.  Review of Systems Pertinent items are noted in HPI.    Objective:    BP 124/78  Pulse 98  Temp(Src) 98.1 F (36.7 C) (Oral)  Resp 16  Wt 150 lb (68.04 kg)  SpO2 95% General:  alert, cooperative, appears stated age and no distress  Skin:  vesicles noted on extremities and face     Assessment:    contact dermatitis: plants poison ivy    Plan:    Medications: benadryl and steroids: pred taper and depo medrol. Written patient instruction given. Follow up in a few days. --prn

## 2013-06-21 NOTE — Telephone Encounter (Signed)
Please advise      KP 

## 2013-07-05 ENCOUNTER — Telehealth: Payer: Self-pay | Admitting: Family Medicine

## 2013-07-05 MED ORDER — TRIAMCINOLONE ACETONIDE 0.5 % EX CREA: 1.0000 "application " | TOPICAL_CREAM | Freq: Three times a day (TID) | CUTANEOUS | Status: DC | PRN

## 2013-07-05 NOTE — Telephone Encounter (Signed)
Rx faxed and the patient has been made aware of recommendations.    KP

## 2013-07-05 NOTE — Telephone Encounter (Signed)
Triamcinolone 0.5  45g  Tid prn  Let us know if she needs ov or derm appointment

## 2013-07-05 NOTE — Telephone Encounter (Signed)
Caller name: Mardel  Call back number:[phone] Pharmacy: [facility]  Reason for call:  Pt was seen on 6/1 for poison ivy/oak and is still have the rash, just now on torso.  Pt would like some steroid cream called in to pharmacy.

## 2013-07-05 NOTE — Telephone Encounter (Signed)
Please advise      KP 

## 2013-08-17 ENCOUNTER — Other Ambulatory Visit: Payer: Self-pay | Admitting: Family Medicine

## 2013-09-21 ENCOUNTER — Telehealth: Payer: Self-pay | Admitting: Family Medicine

## 2013-09-21 DIAGNOSIS — E785 Hyperlipidemia, unspecified: Secondary | ICD-10-CM

## 2013-09-21 MED ORDER — SIMVASTATIN 40 MG PO TABS: ORAL_TABLET | ORAL | Status: DC

## 2013-09-21 NOTE — Telephone Encounter (Signed)
Left message for patient to return my call.

## 2013-09-21 NOTE — Telephone Encounter (Signed)
Patient is requesting a medication refill of simvastatin to be sent to [facility] on [address].

## 2013-09-21 NOTE — Telephone Encounter (Signed)
Labs are due now. Please schedule.  Orders are in   [place]

## 2013-09-21 NOTE — Telephone Encounter (Signed)
Appointment scheduled.

## 2013-09-21 NOTE — Telephone Encounter (Signed)
Patient requesting 90 day supply.

## 2013-09-22 DIAGNOSIS — L819 Disorder of pigmentation, unspecified: Secondary | ICD-10-CM | POA: Diagnosis not present

## 2013-09-22 DIAGNOSIS — Z808 Family history of malignant neoplasm of other organs or systems: Secondary | ICD-10-CM | POA: Diagnosis not present

## 2013-09-22 DIAGNOSIS — D239 Other benign neoplasm of skin, unspecified: Secondary | ICD-10-CM | POA: Diagnosis not present

## 2013-09-22 DIAGNOSIS — L570 Actinic keratosis: Secondary | ICD-10-CM | POA: Diagnosis not present

## 2013-09-22 DIAGNOSIS — D485 Neoplasm of uncertain behavior of skin: Secondary | ICD-10-CM | POA: Diagnosis not present

## 2013-09-22 DIAGNOSIS — D1801 Hemangioma of skin and subcutaneous tissue: Secondary | ICD-10-CM | POA: Diagnosis not present

## 2013-09-22 DIAGNOSIS — L821 Other seborrheic keratosis: Secondary | ICD-10-CM | POA: Diagnosis not present

## 2013-09-23 DIAGNOSIS — L988 Other specified disorders of the skin and subcutaneous tissue: Secondary | ICD-10-CM | POA: Diagnosis not present

## 2013-10-05 ENCOUNTER — Other Ambulatory Visit (INDEPENDENT_AMBULATORY_CARE_PROVIDER_SITE_OTHER): Payer: Medicare Other

## 2013-10-05 DIAGNOSIS — E785 Hyperlipidemia, unspecified: Secondary | ICD-10-CM

## 2013-10-05 DIAGNOSIS — Z23 Encounter for immunization: Secondary | ICD-10-CM | POA: Diagnosis not present

## 2013-10-05 LAB — LIPID PANEL
CHOLESTEROL: 143 mg/dL (ref 0–200)
HDL: 45.7 mg/dL (ref >39.00)
LDL Cholesterol: 62 mg/dL (ref 0–99)
NONHDL: 97.3
Total CHOL/HDL Ratio: 3
Triglycerides: 176 mg/dL — ABNORMAL HIGH (ref 0.0–149.0)
VLDL: 35.2 mg/dL (ref 0.0–40.0)

## 2013-10-05 LAB — HEPATIC FUNCTION PANEL
ALT: 17 U/L (ref 0–35)
AST: 27 U/L (ref 0–37)
Albumin: 4.1 g/dL (ref 3.5–5.2)
Alkaline Phosphatase: 48 U/L (ref 39–117)
BILIRUBIN DIRECT: 0.1 mg/dL (ref 0.0–0.3)
Total Bilirubin: 0.4 mg/dL (ref 0.2–1.2)
Total Protein: 7.1 g/dL (ref 6.0–8.3)

## 2013-11-15 ENCOUNTER — Telehealth: Payer: Self-pay | Admitting: Family Medicine

## 2013-11-15 ENCOUNTER — Other Ambulatory Visit: Payer: Self-pay

## 2013-11-15 DIAGNOSIS — E785 Hyperlipidemia, unspecified: Secondary | ICD-10-CM

## 2013-11-15 DIAGNOSIS — Z1231 Encounter for screening mammogram for malignant neoplasm of breast: Secondary | ICD-10-CM

## 2013-11-15 NOTE — Telephone Encounter (Signed)
Las are not due until December, she had them done last in Sept.      KP

## 2013-11-15 NOTE — Telephone Encounter (Signed)
Caller name: Kariann  Call back number:[phone]   Reason for call:  Pt wants to have labs to check cholesterol.  Can we get placed:?

## 2013-11-15 NOTE — Telephone Encounter (Signed)
Called patient and advised message below

## 2013-12-07 ENCOUNTER — Ambulatory Visit
Admission: RE | Admit: 2013-12-07 | Discharge: 2013-12-07 | Disposition: A | Discharge status: Home / Self Care | Payer: Medicare Other | Source: Ambulatory Visit

## 2013-12-07 DIAGNOSIS — Z1231 Encounter for screening mammogram for malignant neoplasm of breast: Secondary | ICD-10-CM

## 2013-12-14 ENCOUNTER — Ambulatory Visit (INDEPENDENT_AMBULATORY_CARE_PROVIDER_SITE_OTHER): Payer: Medicare Other | Admitting: Family Medicine

## 2013-12-14 ENCOUNTER — Encounter: Payer: Self-pay | Admitting: Family Medicine

## 2013-12-14 VITALS — BP 122/80 | HR 61 | Temp 98.6°F | Wt 148.8 lb

## 2013-12-14 DIAGNOSIS — G479 Sleep disorder, unspecified: Secondary | ICD-10-CM | POA: Diagnosis not present

## 2013-12-14 DIAGNOSIS — E785 Hyperlipidemia, unspecified: Secondary | ICD-10-CM

## 2013-12-14 DIAGNOSIS — R5383 Other fatigue: Secondary | ICD-10-CM | POA: Diagnosis not present

## 2013-12-14 DIAGNOSIS — L853 Xerosis cutis: Secondary | ICD-10-CM | POA: Diagnosis not present

## 2013-12-14 DIAGNOSIS — I10 Essential (primary) hypertension: Secondary | ICD-10-CM

## 2013-12-14 MED ORDER — SIMVASTATIN 40 MG PO TABS: ORAL_TABLET | ORAL | Status: DC

## 2013-12-14 NOTE — Progress Notes (Signed)
Pre visit review using our clinic review tool, if applicable. No additional management support is needed unless otherwise documented below in the visit note. 

## 2013-12-14 NOTE — Patient Instructions (Signed)

## 2013-12-15 ENCOUNTER — Telehealth: Payer: Self-pay | Admitting: Family Medicine

## 2013-12-15 DIAGNOSIS — I10 Essential (primary) hypertension: Secondary | ICD-10-CM

## 2013-12-15 MED ORDER — METOPROLOL TARTRATE 50 MG PO TABS: ORAL_TABLET | ORAL | Status: DC

## 2013-12-15 NOTE — Telephone Encounter (Signed)
Caller name: Elody Relation to pt: self Call back number: [phone] Pharmacy: [facility]  Reason for call:   Patient requesting a refill of metoprolol

## 2013-12-21 ENCOUNTER — Other Ambulatory Visit (INDEPENDENT_AMBULATORY_CARE_PROVIDER_SITE_OTHER): Payer: Medicare Other

## 2013-12-21 DIAGNOSIS — I10 Essential (primary) hypertension: Secondary | ICD-10-CM

## 2013-12-21 DIAGNOSIS — E785 Hyperlipidemia, unspecified: Secondary | ICD-10-CM | POA: Diagnosis not present

## 2013-12-21 LAB — BASIC METABOLIC PANEL
BUN: 12 mg/dL (ref 6–23)
CALCIUM: 9.6 mg/dL (ref 8.4–10.5)
CO2: 27 mEq/L (ref 19–32)
CREATININE: 0.8 mg/dL (ref 0.4–1.2)
Chloride: 107 mEq/L (ref 96–112)
GFR: 81.03 mL/min (ref >60.00)
GLUCOSE: 88 mg/dL (ref 70–99)
Potassium: 3.9 mEq/L (ref 3.5–5.1)
Sodium: 142 mEq/L (ref 135–145)

## 2013-12-21 LAB — LIPID PANEL
Cholesterol: 156 mg/dL (ref 0–200)
HDL: 48.8 mg/dL (ref >39.00)
NonHDL: 107.2
Total CHOL/HDL Ratio: 3
Triglycerides: 204 mg/dL — ABNORMAL HIGH (ref 0.0–149.0)
VLDL: 40.8 mg/dL — ABNORMAL HIGH (ref 0.0–40.0)

## 2013-12-21 LAB — HEPATIC FUNCTION PANEL
ALK PHOS: 49 U/L (ref 39–117)
ALT: 21 U/L (ref 0–35)
AST: 26 U/L (ref 0–37)
Albumin: 4.3 g/dL (ref 3.5–5.2)
BILIRUBIN DIRECT: 0.1 mg/dL (ref 0.0–0.3)
BILIRUBIN TOTAL: 0.7 mg/dL (ref 0.2–1.2)
Total Protein: 6.9 g/dL (ref 6.0–8.3)

## 2013-12-21 LAB — LDL CHOLESTEROL, DIRECT: Direct LDL: 71.2 mg/dL

## 2014-02-24 DIAGNOSIS — L570 Actinic keratosis: Secondary | ICD-10-CM | POA: Diagnosis not present

## 2014-02-24 DIAGNOSIS — L821 Other seborrheic keratosis: Secondary | ICD-10-CM | POA: Diagnosis not present

## 2014-03-03 ENCOUNTER — Other Ambulatory Visit: Payer: Self-pay | Admitting: Family Medicine

## 2014-03-22 ENCOUNTER — Telehealth: Payer: Self-pay | Admitting: Family Medicine

## 2014-03-22 NOTE — Telephone Encounter (Signed)
Caller name: Abbegayle Relation to pt: self Call back number: [phone] Pharmacy:  Reason for call:   Patient is requesting pneumonia inj. Please order.

## 2014-03-22 NOTE — Telephone Encounter (Signed)
Please advise      KP 

## 2014-04-07 NOTE — Telephone Encounter (Signed)
Medically she should have and is due for a Prevnar shot. The only hitch is some insurances won't pay for a second shot, she may want to just clear with her insurance.

## 2014-04-07 NOTE — Telephone Encounter (Signed)
Detailed message left for the patient to check with her insurance company to ensure they will pay for the injection and to call the office with any questions or concerns.      KP

## 2014-04-12 ENCOUNTER — Ambulatory Visit: Payer: Medicare Other

## 2014-04-18 DIAGNOSIS — Z23 Encounter for immunization: Secondary | ICD-10-CM | POA: Diagnosis not present

## 2014-05-17 ENCOUNTER — Telehealth: Payer: Self-pay | Admitting: Family Medicine

## 2014-05-17 NOTE — Telephone Encounter (Signed)
previsit letter for annual exam mailed

## 2014-06-06 ENCOUNTER — Telehealth: Payer: Self-pay | Admitting: *Deleted

## 2014-06-06 ENCOUNTER — Telehealth: Payer: Self-pay | Admitting: Family Medicine

## 2014-06-06 NOTE — Telephone Encounter (Signed)
Unable to reach patient at time of Pre-Visit Call.  Left message for patient to return call when available.    

## 2014-06-06 NOTE — Telephone Encounter (Signed)
Returning your call. °

## 2014-06-06 NOTE — Telephone Encounter (Signed)
Attempted to return patient call and left voicemail for return call.

## 2014-06-07 ENCOUNTER — Ambulatory Visit (INDEPENDENT_AMBULATORY_CARE_PROVIDER_SITE_OTHER): Payer: Medicare Other | Admitting: Family Medicine

## 2014-06-07 ENCOUNTER — Encounter: Payer: Self-pay | Admitting: Family Medicine

## 2014-06-07 VITALS — BP 142/80 | HR 56 | Temp 98.3°F | Resp 18 | Ht 65.0 in | Wt 147.0 lb

## 2014-06-07 DIAGNOSIS — I10 Essential (primary) hypertension: Secondary | ICD-10-CM | POA: Diagnosis not present

## 2014-06-07 DIAGNOSIS — R8290 Unspecified abnormal findings in urine: Secondary | ICD-10-CM | POA: Diagnosis not present

## 2014-06-07 DIAGNOSIS — Z0000 Encounter for general adult medical examination without abnormal findings: Secondary | ICD-10-CM

## 2014-06-07 DIAGNOSIS — R8299 Other abnormal findings in urine: Secondary | ICD-10-CM | POA: Diagnosis not present

## 2014-06-07 DIAGNOSIS — E785 Hyperlipidemia, unspecified: Secondary | ICD-10-CM

## 2014-06-07 LAB — LIPID PANEL
Cholesterol: 147 mg/dL (ref 0–200)
HDL: 58.6 mg/dL (ref >39.00)
LDL Cholesterol: 54 mg/dL (ref 0–99)
NonHDL: 88.4
TRIGLYCERIDES: 170 mg/dL — AB (ref 0.0–149.0)
Total CHOL/HDL Ratio: 3
VLDL: 34 mg/dL (ref 0.0–40.0)

## 2014-06-07 LAB — POCT URINALYSIS DIPSTICK
BILIRUBIN UA: NEGATIVE
Glucose, UA: NEGATIVE
KETONES UA: NEGATIVE
Leukocytes, UA: NEGATIVE
Nitrite, UA: NEGATIVE
PH UA: 6
Spec Grav, UA: 1.02
Urobilinogen, UA: 0.2

## 2014-06-07 LAB — CBC WITH DIFFERENTIAL/PLATELET
Basophils Absolute: 0 10*3/uL (ref 0.0–0.1)
Basophils Relative: 0.6 % (ref 0.0–3.0)
EOS PCT: 2.3 % (ref 0.0–5.0)
Eosinophils Absolute: 0.1 10*3/uL (ref 0.0–0.7)
HEMATOCRIT: 40.3 % (ref 36.0–46.0)
Hemoglobin: 14 g/dL (ref 12.0–15.0)
LYMPHS ABS: 1.8 10*3/uL (ref 0.7–4.0)
Lymphocytes Relative: 34.7 % (ref 12.0–46.0)
MCHC: 34.8 g/dL (ref 30.0–36.0)
MCV: 88.1 fl (ref 78.0–100.0)
MONOS PCT: 8.1 % (ref 3.0–12.0)
Monocytes Absolute: 0.4 10*3/uL (ref 0.1–1.0)
Neutro Abs: 2.8 10*3/uL (ref 1.4–7.7)
Neutrophils Relative %: 54.3 % (ref 43.0–77.0)
Platelets: 158 10*3/uL (ref 150.0–400.0)
RBC: 4.58 Mil/uL (ref 3.87–5.11)
RDW: 13 % (ref 11.5–15.5)
WBC: 5.2 10*3/uL (ref 4.0–10.5)

## 2014-06-07 LAB — BASIC METABOLIC PANEL
BUN: 11 mg/dL (ref 6–23)
CO2: 32 mEq/L (ref 19–32)
Calcium: 9.8 mg/dL (ref 8.4–10.5)
Chloride: 106 mEq/L (ref 96–112)
Creatinine, Ser: 0.7 mg/dL (ref 0.40–1.20)
GFR: 88.98 mL/min (ref >60.00)
GLUCOSE: 99 mg/dL (ref 70–99)
POTASSIUM: 4.1 meq/L (ref 3.5–5.1)
Sodium: 140 mEq/L (ref 135–145)

## 2014-06-07 LAB — HEPATIC FUNCTION PANEL
ALK PHOS: 54 U/L (ref 39–117)
ALT: 17 U/L (ref 0–35)
AST: 25 U/L (ref 0–37)
Albumin: 4.4 g/dL (ref 3.5–5.2)
Bilirubin, Direct: 0.1 mg/dL (ref 0.0–0.3)
Total Bilirubin: 0.7 mg/dL (ref 0.2–1.2)
Total Protein: 7.1 g/dL (ref 6.0–8.3)

## 2014-06-07 MED ORDER — SIMVASTATIN 20 MG PO TABS: 20.0000 mg | ORAL_TABLET | Freq: Every day | ORAL | Status: DC

## 2014-06-07 MED ORDER — METOPROLOL TARTRATE 50 MG PO TABS: ORAL_TABLET | ORAL | Status: DC

## 2014-06-07 NOTE — Progress Notes (Signed)
Pre visit review using our clinic review tool, if applicable. No additional management support is needed unless otherwise documented below in the visit note. 

## 2014-06-07 NOTE — Progress Notes (Signed)
Subjective:   Tiffany Graham is a 64 y.o. female who presents for an Initial Medicare Annual Wellness Visit.  Review of Systems     Review of Systems  Constitutional: Negative for activity change, appetite change and fatigue.  HENT: Negative for hearing loss, congestion, tinnitus and ear discharge.   Eyes: Negative for visual disturbance (see optho q1y -- vision corrected to 20/20 with glasses).  Respiratory: Negative for cough, chest tightness and shortness of breath.   Cardiovascular: Negative for chest pain, palpitations and leg swelling.  Gastrointestinal: Negative for abdominal pain, diarrhea, constipation and abdominal distention.  Genitourinary: Negative for urgency, frequency, decreased urine volume and difficulty urinating.  Musculoskeletal: Negative for back pain, arthralgias and gait problem.  Skin: Negative for color change, pallor and rash.  Neurological: Negative for dizziness, light-headedness, numbness and headaches.  Hematological: Negative for adenopathy. Does not bruise/bleed easily.  Psychiatric/Behavioral: Negative for suicidal ideas, confusion, sleep disturbance, self-injury, dysphoric mood, decreased concentration and agitation.  Pt is able to read and write and can Tiffany Graham all ADLs No risk for falling No abuse/ violence in home           Objective:    Today's Vitals   06/07/14 0842  BP: 142/80  Pulse: 56  Temp: 98.3 F (36.8 C)  TempSrc: Oral  Resp: 18  Height: 5' 5"  (1.651 m)  Weight: 147 lb (66.679 kg)  SpO2: 99%   . Current Medications (verified) Outpatient Encounter Prescriptions as of 06/07/2014  Medication Sig  . aspirin 81 MG tablet Take 81 mg by mouth daily.  . calcium carbonate (OS-CAL) 600 MG TABS tablet Take 600 mg by mouth 2 (two) times daily with a meal.  . Cholecalciferol (VITAMIN D3) 1000 UNITS CAPS Take by mouth.  Marland Kitchen glucosamine-chondroitin 500-400 MG tablet Take 1 tablet by mouth 3 (three) times daily.  . metoprolol (LOPRESSOR)  50 MG tablet TAKE 1 TABLET BY MOUTH EVERY MORNING AND 2 TABLETS EVERY EVENING  . Omega-3 Fatty Acids (FISH OIL) 1000 MG CAPS Take 1 capsule by mouth daily.  . [DISCONTINUED] simvastatin (ZOCOR) 40 MG tablet TAKE 1 TABLET BY MOUTH ATBED TIME  . PREVNAR 13 SUSP injection   . simvastatin (ZOCOR) 20 MG tablet Take 1 tablet (20 mg total) by mouth at bedtime.  . [DISCONTINUED] Ascorbic Acid (VITAMIN C) 1000 MG tablet Take 1,000 mg by mouth daily.  . [DISCONTINUED] vitamin E 1000 UNIT capsule Take 1,000 Units by mouth daily.   No facility-administered encounter medications on file as of 06/07/2014.    Allergies (verified) Triprolidine-pseudoephedrine   History: Past Medical History  Diagnosis Date  . Hypertension   . Hyperlipidemia    Past Surgical History  Procedure Laterality Date  . Appendectomy  1991  . Cholecystectomy  2004  . Total abdominal hysterectomy  1991    with BSO, secondary fibroids  . Tubal ligation  1987    first one 1974, then reversed 1983   Family History  Problem Relation Age of Onset  . Colon cancer    . Coronary artery disease    . Asthma    . Hyperlipidemia    . Hypertension    . Osteoporosis    . Thyroid disease    . Clotting disorder      thromboembolism  . Colon cancer Father    Social History   Occupational History  . Not on file.   Social History Main Topics  . Smoking status: Former Smoker    Quit date:  01/21/1981  . Smokeless tobacco: Never Used  . Alcohol Use: 2.4 oz/week    4 Glasses of wine per week     Comment: 1 bottle of wine per weekend  . Drug Use: No  . Sexual Activity: Not on file    Tobacco Counseling Counseling given: Not Answered   Activities of Daily Living In your present state of health, Tiffany Graham you have any difficulty performing the following activities: 06/07/2014  Hearing? N  Vision? Y  Difficulty concentrating or making decisions? N  Walking or climbing stairs? N  Dressing or bathing? N  Doing errands, shopping?  N    Immunizations and Health Maintenance Immunization History  Administered Date(s) Administered  . Hepatitis B 02/28/1999, 03/28/1999, 08/28/1999  . Influenza Whole 11/02/2008, 10/22/2011  . Influenza,inj,Quad PF,36+ Mos 10/05/2013  . Influenza-Unspecified 10/06/2012  . Pneumococcal Polysaccharide-23 02/10/2013  . Td 01/21/2006  . Tdap 01/03/2006  . Zoster 03/16/2009   There are no preventive care reminders to display for this patient.  Patient Care Team: Rosalita Chessman, Tiffany Graham as PCP - General  Indicate any recent Medical Services you may have received from other than [facility] providers in the past year (date may be approximate).     Assessment:   This is a routine wellness examination for Tiffany Graham.   Hearing/Vision screen No exam data present  Dietary issues and exercise activities discussed:    Goals    None     Depression Screen PHQ 2/9 Scores 12/14/2013  PHQ - 2 Score 0    Fall Risk Fall Risk  12/14/2013  Falls in the past year? No    Cognitive Function: No flowsheet data found.  Screening Tests Health Maintenance  Topic Date Due  . INFLUENZA VACCINE  08/22/2014  . MAMMOGRAM  12/08/2015  . TETANUS/TDAP  01/22/2016  . COLONOSCOPY  03/16/2017  . PNA vac Low Risk Adult (2 of 2 - PPSV23) 02/10/2018  . DEXA SCAN  Completed  . ZOSTAVAX  Completed      Plan:   see AVS  During the course of the visit, Tiffany Graham was educated and counseled about the following appropriate screening and preventive services:   Vaccines to include Pneumoccal, Influenza, Hepatitis B, Td, Zostavax, HCV  Electrocardiogram  Cardiovascular disease screening  Colorectal cancer screening  Bone density screening  Diabetes screening  Glaucoma screening  Mammography/PAP  Nutrition counseling  Smoking cessation counseling  Patient Instructions (the written plan) were given to the patient.   1. Medicare annual wellness visit, initial See above  2. Hyperlipidemia LDL goal  <100 Check labs - simvastatin (ZOCOR) 20 MG tablet; Take 1 tablet (20 mg total) by mouth at bedtime.  Dispense: 90 tablet; Refill: 3 - Hepatic function panel - Lipid panel - POCT urinalysis dipstick  3. Essential hypertension Stable, con't metoprolol - Basic metabolic panel - CBC with Differential/Platelet - POCT urinalysis dipstick  Tiffany Koyanagi, Tiffany Graham   06/07/2014         Subjective:   Tiffany Graham is a 64 y.o. female who presents for an Initial Medicare Annual Wellness Visit.  Review of Systems     Review of Systems  Constitutional: Negative for activity change, appetite change and fatigue.  HENT: Negative for hearing loss, congestion, tinnitus and ear discharge.   Eyes: Negative for visual disturbance (see optho q1y -- vision corrected to 20/20 with glasses).  Respiratory: Negative for cough, chest tightness and shortness of breath.   Cardiovascular: Negative for chest pain, palpitations and leg swelling.  Gastrointestinal: Negative for abdominal pain, diarrhea, constipation and abdominal distention.  Genitourinary: Negative for urgency, frequency, decreased urine volume and difficulty urinating.  Musculoskeletal: Negative for back pain, arthralgias and gait problem.  Skin: Negative for color change, pallor and rash.  Neurological: Negative for dizziness, light-headedness, numbness and headaches.  Hematological: Negative for adenopathy. Does not bruise/bleed easily.  Psychiatric/Behavioral: Negative for suicidal ideas, confusion, sleep disturbance, self-injury, dysphoric mood, decreased concentration and agitation.  Pt is able to read and write and can Tiffany Graham all ADLs No risk for falling No abuse/ violence in home           Objective:    Today's Vitals   06/07/14 0842  BP: 142/80  Pulse: 56  Temp: 98.3 F (36.8 C)  TempSrc: Oral  Resp: 18  Height: 5' 5"  (1.651 m)  Weight: 147 lb (66.679 kg)  SpO2: 99%  BP 142/80 mmHg  Pulse 56  Temp(Src) 98.3 F (36.8 C)  (Oral)  Resp 18  Ht 5' 5"  (1.651 m)  Wt 147 lb (66.679 kg)  BMI 24.46 kg/m2  SpO2 99% General appearance: alert, cooperative, appears stated age and no distress Head: Normocephalic, without obvious abnormality, atraumatic Eyes: conjunctivae/corneas clear. PERRL, EOM's intact. Fundi benign. Ears: normal TM's and external ear canals both ears Nose: Nares normal. Septum midline. Mucosa normal. No drainage or sinus tenderness. Throat: lips, mucosa, and tongue normal; teeth and gums normal Neck: no adenopathy, no carotid bruit, no JVD, supple, symmetrical, trachea midline and thyroid not enlarged, symmetric, no tenderness/mass/nodules Back: symmetric, no curvature. ROM normal. No CVA tenderness. Lungs: clear to auscultation bilaterally Breasts: normal appearance, no masses or tenderness Heart: regular rate and rhythm, S1, S2 normal, no murmur, click, rub or gallop Abdomen: soft, non-tender; bowel sounds normal; no masses,  no organomegaly Pelvic: not indicated; post-menopausal, no abnormal Pap smears in past Extremities: extremities normal, atraumatic, no cyanosis or edema Pulses: 2+ and symmetric Skin: Skin color, texture, turgor normal. No rashes or lesions Lymph nodes: Cervical, supraclavicular, and axillary nodes normal. Neurologic: Alert and oriented X 3, normal strength and tone. Normal symmetric reflexes. Normal coordination and gait Psych- no depression, no anxiety  Current Medications (verified) Outpatient Encounter Prescriptions as of 06/07/2014  Medication Sig  . aspirin 81 MG tablet Take 81 mg by mouth daily.  . calcium carbonate (OS-CAL) 600 MG TABS tablet Take 600 mg by mouth 2 (two) times daily with a meal.  . Cholecalciferol (VITAMIN D3) 1000 UNITS CAPS Take by mouth.  Marland Kitchen glucosamine-chondroitin 500-400 MG tablet Take 1 tablet by mouth 3 (three) times daily.  . metoprolol (LOPRESSOR) 50 MG tablet TAKE 1 TABLET BY MOUTH EVERY MORNING AND 2 TABLETS EVERY EVENING  . Omega-3  Fatty Acids (FISH OIL) 1000 MG CAPS Take 1 capsule by mouth daily.  . [DISCONTINUED] metoprolol (LOPRESSOR) 50 MG tablet TAKE 1 TABLET BY MOUTH EVERY MORNING AND 2 TABLETS EVERY EVENING  . [DISCONTINUED] simvastatin (ZOCOR) 40 MG tablet TAKE 1 TABLET BY MOUTH ATBED TIME  . PREVNAR 13 SUSP injection   . simvastatin (ZOCOR) 20 MG tablet Take 1 tablet (20 mg total) by mouth at bedtime.  . [DISCONTINUED] Ascorbic Acid (VITAMIN C) 1000 MG tablet Take 1,000 mg by mouth daily.  . [DISCONTINUED] vitamin E 1000 UNIT capsule Take 1,000 Units by mouth daily.   No facility-administered encounter medications on file as of 06/07/2014.    Allergies (verified) Triprolidine-pseudoephedrine   History: Past Medical History  Diagnosis Date  . Hypertension   . Hyperlipidemia  Past Surgical History  Procedure Laterality Date  . Appendectomy  1991  . Cholecystectomy  2004  . Total abdominal hysterectomy  1991    with BSO, secondary fibroids  . Tubal ligation  1987    first one 1974, then reversed 1983   Family History  Problem Relation Age of Onset  . Colon cancer    . Coronary artery disease    . Asthma    . Hyperlipidemia    . Hypertension    . Osteoporosis    . Thyroid disease    . Clotting disorder      thromboembolism  . Colon cancer Father    Social History   Occupational History  . Not on file.   Social History Main Topics  . Smoking status: Former Smoker    Quit date: 01/21/1981  . Smokeless tobacco: Never Used  . Alcohol Use: 2.4 oz/week    4 Glasses of wine per week     Comment: 1 bottle of wine per weekend  . Drug Use: No  . Sexual Activity: Not on file    Tobacco Counseling Counseling given: Not Answered   Activities of Daily Living In your present state of health, Tiffany Graham you have any difficulty performing the following activities: 06/07/2014  Hearing? N  Vision? Y  Difficulty concentrating or making decisions? N  Walking or climbing stairs? N  Dressing or  bathing? N  Doing errands, shopping? N    Immunizations and Health Maintenance Immunization History  Administered Date(s) Administered  . Hepatitis B 02/28/1999, 03/28/1999, 08/28/1999  . Influenza Whole 11/02/2008, 10/22/2011  . Influenza,inj,Quad PF,36+ Mos 10/05/2013  . Influenza-Unspecified 10/06/2012  . Pneumococcal Conjugate-13 04/03/2014  . Pneumococcal Polysaccharide-23 02/10/2013  . Td 01/21/2006  . Tdap 01/03/2006  . Zoster 03/16/2009   There are no preventive care reminders to display for this patient.  Patient Care Team: Rosalita Chessman, Tiffany Graham as PCP - General Tiffany Drought, Tiffany Graham as Consulting Physician (Ophthalmology)  Indicate any recent Medical Services you may have received from other than [facility] providers in the past year (date may be approximate).     Assessment:   This is a routine wellness examination for Tiffany Graham.   Hearing/Vision screen No exam data present----  See opth  Dietary issues and exercise activities discussed: cont exercise    Goals    None     Depression Screen PHQ 2/9 Scores 12/14/2013  PHQ - 2 Score 0    Fall Risk Fall Risk  12/14/2013  Falls in the past year? No    Cognitive Function: AAOX3 NAD  Screening Tests Health Maintenance  Topic Date Due  . INFLUENZA VACCINE  08/22/2014  . MAMMOGRAM  12/08/2015  . TETANUS/TDAP  01/22/2016  . COLONOSCOPY  03/16/2017  . PNA vac Low Risk Adult (2 of 2 - PPSV23) 02/10/2018  . DEXA SCAN  Completed  . ZOSTAVAX  Completed      Plan:    see AVS Check labs  During the course of the visit, Tiffany Graham was educated and counseled about the following appropriate screening and preventive services:   Vaccines to include Pneumoccal, Influenza, Hepatitis B, Td, Zostavax, HCV  Electrocardiogram  Cardiovascular disease screening  Colorectal cancer screening  Bone density screening  Diabetes screening  Glaucoma screening  Mammography/PAP  Nutrition counseling  Smoking cessation  counseling  Patient Instructions (the written plan) were given to the patient.   1. Medicare annual wellness visit, initial See above  2. Hyperlipidemia LDL goal <100 Check labs -  simvastatin (ZOCOR) 20 MG tablet; Take 1 tablet (20 mg total) by mouth at bedtime.  Dispense: 90 tablet; Refill: 3 - Hepatic function panel - Lipid panel - POCT urinalysis dipstick  3. Essential hypertension stable - Basic metabolic panel - CBC with Differential/Platelet - POCT urinalysis dipstick - metoprolol (LOPRESSOR) 50 MG tablet; TAKE 1 TABLET BY MOUTH EVERY MORNING AND 2 TABLETS EVERY EVENING  Dispense: 270 tablet; Refill: 1  4. Routine history and physical examination of adult    5. Abnormal urine   - Urine Culture  Tiffany Koyanagi, Tiffany Graham   06/07/2014

## 2014-06-07 NOTE — Patient Instructions (Addendum)
Preventive Care for Adults A healthy lifestyle and preventive care can promote health and wellness. Preventive health guidelines for women include the following key practices.  A routine yearly physical is a good way to check with your health care provider about your health and preventive screening. It is a chance to share any concerns and updates on your health and to receive a thorough exam.  Visit your dentist for a routine exam and preventive care every 6 months. Brush your teeth twice a day and floss once a day. Good oral hygiene prevents tooth decay and gum disease.  The frequency of eye exams is based on your age, health, family medical history, use of contact lenses, and other factors. Follow your health care provider's recommendations for frequency of eye exams.  Eat a healthy diet. Foods like vegetables, fruits, whole grains, low-fat dairy products, and lean protein foods contain the nutrients you need without too many calories. Decrease your intake of foods high in solid fats, added sugars, and salt. Eat the right amount of calories for you.Get information about a proper diet from your health care provider, if necessary.  Regular physical exercise is one of the most important things you can do for your health. Most adults should get at least 150 minutes of moderate-intensity exercise (any activity that increases your heart rate and causes you to sweat) each week. In addition, most adults need muscle-strengthening exercises on 2 or more days a week.  Maintain a healthy weight. The body mass index (BMI) is a screening tool to identify possible weight problems. It provides an estimate of body fat based on height and weight. Your health care provider can find your BMI and can help you achieve or maintain a healthy weight.For adults 20 years and older:  A BMI below 18.5 is considered underweight.  A BMI of 18.5 to 24.9 is normal.  A BMI of 25 to 29.9 is considered overweight.  A BMI of  30 and above is considered obese.  Maintain normal blood lipids and cholesterol levels by exercising and minimizing your intake of saturated fat. Eat a balanced diet with plenty of fruit and vegetables. Blood tests for lipids and cholesterol should begin at age 76 and be repeated every 5 years. If your lipid or cholesterol levels are high, you are over 50, or you are at high risk for heart disease, you may need your cholesterol levels checked more frequently.Ongoing high lipid and cholesterol levels should be treated with medicines if diet and exercise are not working.  If you smoke, find out from your health care provider how to quit. If you do not use tobacco, do not start.  Lung cancer screening is recommended for adults aged 22-80 years who are at high risk for developing lung cancer because of a history of smoking. A yearly low-dose CT scan of the lungs is recommended for people who have at least a 30-pack-year history of smoking and are a current smoker or have quit within the past 15 years. A pack year of smoking is smoking an average of 1 pack of cigarettes a day for 1 year (for example: 1 pack a day for 30 years or 2 packs a day for 15 years). Yearly screening should continue until the smoker has stopped smoking for at least 15 years. Yearly screening should be stopped for people who develop a health problem that would prevent them from having lung cancer treatment.  If you are pregnant, do not drink alcohol. If you are breastfeeding,  be very cautious about drinking alcohol. If you are not pregnant and choose to drink alcohol, do not have more than 1 drink per day. One drink is considered to be 12 ounces (355 mL) of beer, 5 ounces (148 mL) of wine, or 1.5 ounces (44 mL) of liquor.  Avoid use of street drugs. Do not share needles with anyone. Ask for help if you need support or instructions about stopping the use of drugs.  High blood pressure causes heart disease and increases the risk of  stroke. Your blood pressure should be checked at least every 1 to 2 years. Ongoing high blood pressure should be treated with medicines if weight loss and exercise do not work.  If you are 75-52 years old, ask your health care provider if you should take aspirin to prevent strokes.  Diabetes screening involves taking a blood sample to check your fasting blood sugar level. This should be done once every 3 years, after age 15, if you are within normal weight and without risk factors for diabetes. Testing should be considered at a younger age or be carried out more frequently if you are overweight and have at least 1 risk factor for diabetes.  Breast cancer screening is essential preventive care for women. You should practice "breast self-awareness." This means understanding the normal appearance and feel of your breasts and may include breast self-examination. Any changes detected, no matter how small, should be reported to a health care provider. Women in their 58s and 30s should have a clinical breast exam (CBE) by a health care provider as part of a regular health exam every 1 to 3 years. After age 16, women should have a CBE every year. Starting at age 53, women should consider having a mammogram (breast X-ray test) every year. Women who have a family history of breast cancer should talk to their health care provider about genetic screening. Women at a high risk of breast cancer should talk to their health care providers about having an MRI and a mammogram every year.  Breast cancer gene (BRCA)-related cancer risk assessment is recommended for women who have family members with BRCA-related cancers. BRCA-related cancers include breast, ovarian, tubal, and peritoneal cancers. Having family members with these cancers may be associated with an increased risk for harmful changes (mutations) in the breast cancer genes BRCA1 and BRCA2. Results of the assessment will determine the need for genetic counseling and  BRCA1 and BRCA2 testing.  Routine pelvic exams to screen for cancer are no longer recommended for nonpregnant women who are considered low risk for cancer of the pelvic organs (ovaries, uterus, and vagina) and who do not have symptoms. Ask your health care provider if a screening pelvic exam is right for you.  If you have had past treatment for cervical cancer or a condition that could lead to cancer, you need Pap tests and screening for cancer for at least 20 years after your treatment. If Pap tests have been discontinued, your risk factors (such as having a new sexual partner) need to be reassessed to determine if screening should be resumed. Some women have medical problems that increase the chance of getting cervical cancer. In these cases, your health care provider may recommend more frequent screening and Pap tests.  The HPV test is an additional test that may be used for cervical cancer screening. The HPV test looks for the virus that can cause the cell changes on the cervix. The cells collected during the Pap test can be  tested for HPV. The HPV test could be used to screen women aged 30 years and older, and should be used in women of any age who have unclear Pap test results. After the age of 30, women should have HPV testing at the same frequency as a Pap test.  Colorectal cancer can be detected and often prevented. Most routine colorectal cancer screening begins at the age of 50 years and continues through age 75 years. However, your health care provider may recommend screening at an earlier age if you have risk factors for colon cancer. On a yearly basis, your health care provider may provide home test kits to check for hidden blood in the stool. Use of a small camera at the end of a tube, to directly examine the colon (sigmoidoscopy or colonoscopy), can detect the earliest forms of colorectal cancer. Talk to your health care provider about this at age 50, when routine screening begins. Direct  exam of the colon should be repeated every 5-10 years through age 75 years, unless early forms of pre-cancerous polyps or small growths are found.  People who are at an increased risk for hepatitis B should be screened for this virus. You are considered at high risk for hepatitis B if:  You were born in a country where hepatitis B occurs often. Talk with your health care provider about which countries are considered high risk.  Your parents were born in a high-risk country and you have not received a shot to protect against hepatitis B (hepatitis B vaccine).  You have HIV or AIDS.  You use needles to inject street drugs.  You live with, or have sex with, someone who has hepatitis B.  You get hemodialysis treatment.  You take certain medicines for conditions like cancer, organ transplantation, and autoimmune conditions.  Hepatitis C blood testing is recommended for all people born from 1945 through 1965 and any individual with known risks for hepatitis C.  Practice safe sex. Use condoms and avoid high-risk sexual practices to reduce the spread of sexually transmitted infections (STIs). STIs include gonorrhea, chlamydia, syphilis, trichomonas, herpes, HPV, and human immunodeficiency virus (HIV). Herpes, HIV, and HPV are viral illnesses that have no cure. They can result in disability, cancer, and death.  You should be screened for sexually transmitted illnesses (STIs) including gonorrhea and chlamydia if:  You are sexually active and are younger than 24 years.  You are older than 24 years and your health care provider tells you that you are at risk for this type of infection.  Your sexual activity has changed since you were last screened and you are at an increased risk for chlamydia or gonorrhea. Ask your health care provider if you are at risk.  If you are at risk of being infected with HIV, it is recommended that you take a prescription medicine daily to prevent HIV infection. This is  called preexposure prophylaxis (PrEP). You are considered at risk if:  You are a heterosexual woman, are sexually active, and are at increased risk for HIV infection.  You take drugs by injection.  You are sexually active with a partner who has HIV.  Talk with your health care provider about whether you are at high risk of being infected with HIV. If you choose to begin PrEP, you should first be tested for HIV. You should then be tested every 3 months for as long as you are taking PrEP.  Osteoporosis is a disease in which the bones lose minerals and strength   with aging. This can result in serious bone fractures or breaks. The risk of osteoporosis can be identified using a bone density scan. Women ages 65 years and over and women at risk for fractures or osteoporosis should discuss screening with their health care providers. Ask your health care provider whether you should take a calcium supplement or vitamin D to reduce the rate of osteoporosis.  Menopause can be associated with physical symptoms and risks. Hormone replacement therapy is available to decrease symptoms and risks. You should talk to your health care provider about whether hormone replacement therapy is right for you.  Use sunscreen. Apply sunscreen liberally and repeatedly throughout the day. You should seek shade when your shadow is shorter than you. Protect yourself by wearing long sleeves, pants, a wide-brimmed hat, and sunglasses year round, whenever you are outdoors.  Once a month, do a whole body skin exam, using a mirror to look at the skin on your back. Tell your health care provider of new moles, moles that have irregular borders, moles that are larger than a pencil eraser, or moles that have changed in shape or color.  Stay current with required vaccines (immunizations).  Influenza vaccine. All adults should be immunized every year.  Tetanus, diphtheria, and acellular pertussis (Td, Tdap) vaccine. Pregnant women should  receive 1 dose of Tdap vaccine during each pregnancy. The dose should be obtained regardless of the length of time since the last dose. Immunization is preferred during the 27th-36th week of gestation. An adult who has not previously received Tdap or who does not know her vaccine status should receive 1 dose of Tdap. This initial dose should be followed by tetanus and diphtheria toxoids (Td) booster doses every 10 years. Adults with an unknown or incomplete history of completing a 3-dose immunization series with Td-containing vaccines should begin or complete a primary immunization series including a Tdap dose. Adults should receive a Td booster every 10 years.  Varicella vaccine. An adult without evidence of immunity to varicella should receive 2 doses or a second dose if she has previously received 1 dose. Pregnant females who do not have evidence of immunity should receive the first dose after pregnancy. This first dose should be obtained before leaving the health care facility. The second dose should be obtained 4-8 weeks after the first dose.  Human papillomavirus (HPV) vaccine. Females aged 13-26 years who have not received the vaccine previously should obtain the 3-dose series. The vaccine is not recommended for use in pregnant females. However, pregnancy testing is not needed before receiving a dose. If a female is found to be pregnant after receiving a dose, no treatment is needed. In that case, the remaining doses should be delayed until after the pregnancy. Immunization is recommended for any person with an immunocompromised condition through the age of 26 years if she did not get any or all doses earlier. During the 3-dose series, the second dose should be obtained 4-8 weeks after the first dose. The third dose should be obtained 24 weeks after the first dose and 16 weeks after the second dose.  Zoster vaccine. One dose is recommended for adults aged 60 years or older unless certain conditions are  present.  Measles, mumps, and rubella (MMR) vaccine. Adults born before 1957 generally are considered immune to measles and mumps. Adults born in 1957 or later should have 1 or more doses of MMR vaccine unless there is a contraindication to the vaccine or there is laboratory evidence of immunity to   each of the three diseases. A routine second dose of MMR vaccine should be obtained at least 28 days after the first dose for students attending postsecondary schools, health care workers, or international travelers. People who received inactivated measles vaccine or an unknown type of measles vaccine during 1963-1967 should receive 2 doses of MMR vaccine. People who received inactivated mumps vaccine or an unknown type of mumps vaccine before 1979 and are at high risk for mumps infection should consider immunization with 2 doses of MMR vaccine. For females of childbearing age, rubella immunity should be determined. If there is no evidence of immunity, females who are not pregnant should be vaccinated. If there is no evidence of immunity, females who are pregnant should delay immunization until after pregnancy. Unvaccinated health care workers born before 1957 who lack laboratory evidence of measles, mumps, or rubella immunity or laboratory confirmation of disease should consider measles and mumps immunization with 2 doses of MMR vaccine or rubella immunization with 1 dose of MMR vaccine.  Pneumococcal 13-valent conjugate (PCV13) vaccine. When indicated, a person who is uncertain of her immunization history and has no record of immunization should receive the PCV13 vaccine. An adult aged 19 years or older who has certain medical conditions and has not been previously immunized should receive 1 dose of PCV13 vaccine. This PCV13 should be followed with a dose of pneumococcal polysaccharide (PPSV23) vaccine. The PPSV23 vaccine dose should be obtained at least 8 weeks after the dose of PCV13 vaccine. An adult aged 19  years or older who has certain medical conditions and previously received 1 or more doses of PPSV23 vaccine should receive 1 dose of PCV13. The PCV13 vaccine dose should be obtained 1 or more years after the last PPSV23 vaccine dose.  Pneumococcal polysaccharide (PPSV23) vaccine. When PCV13 is also indicated, PCV13 should be obtained first. All adults aged 65 years and older should be immunized. An adult younger than age 65 years who has certain medical conditions should be immunized. Any person who resides in a nursing home or long-term care facility should be immunized. An adult smoker should be immunized. People with an immunocompromised condition and certain other conditions should receive both PCV13 and PPSV23 vaccines. People with human immunodeficiency virus (HIV) infection should be immunized as soon as possible after diagnosis. Immunization during chemotherapy or radiation therapy should be avoided. Routine use of PPSV23 vaccine is not recommended for American Indians, [place] Natives, or people younger than 65 years unless there are medical conditions that require PPSV23 vaccine. When indicated, people who have unknown immunization and have no record of immunization should receive PPSV23 vaccine. One-time revaccination 5 years after the first dose of PPSV23 is recommended for people aged 19-64 years who have chronic kidney failure, nephrotic syndrome, asplenia, or immunocompromised conditions. People who received 1-2 doses of PPSV23 before age 65 years should receive another dose of PPSV23 vaccine at age 65 years or later if at least 5 years have passed since the previous dose. Doses of PPSV23 are not needed for people immunized with PPSV23 at or after age 65 years.  Meningococcal vaccine. Adults with asplenia or persistent complement component deficiencies should receive 2 doses of quadrivalent meningococcal conjugate (MenACWY-D) vaccine. The doses should be obtained at least 2 months apart.  Microbiologists working with certain meningococcal bacteria, military recruits, people at risk during an outbreak, and people who travel to or live in countries with a high rate of meningitis should be immunized. A first-year college student up through age   21 years who is living in a residence hall should receive a dose if she did not receive a dose on or after her 16th birthday. Adults who have certain high-risk conditions should receive one or more doses of vaccine.  Hepatitis A vaccine. Adults who wish to be protected from this disease, have certain high-risk conditions, work with hepatitis A-infected animals, work in hepatitis A research labs, or travel to or work in countries with a high rate of hepatitis A should be immunized. Adults who were previously unvaccinated and who anticipate close contact with an international adoptee during the first 60 days after arrival in the [place] from a country with a high rate of hepatitis A should be immunized.  Hepatitis B vaccine. Adults who wish to be protected from this disease, have certain high-risk conditions, may be exposed to blood or other infectious body fluids, are household contacts or sex partners of hepatitis B positive people, are clients or workers in certain care facilities, or travel to or work in countries with a high rate of hepatitis B should be immunized.  Haemophilus influenzae type b (Hib) vaccine. A previously unvaccinated person with asplenia or sickle cell disease or having a scheduled splenectomy should receive 1 dose of Hib vaccine. Regardless of previous immunization, a recipient of a hematopoietic stem cell transplant should receive a 3-dose series 6-12 months after her successful transplant. Hib vaccine is not recommended for adults with HIV infection. Preventive Services / Frequency Ages 64 to 68 years  Blood pressure check.** / Every 1 to 2 years.  Lipid and cholesterol check.** / Every 5 years beginning at age  22.  Clinical breast exam.** / Every 3 years for women in their 88s and 53s.  BRCA-related cancer risk assessment.** / For women who have family members with a BRCA-related cancer (breast, ovarian, tubal, or peritoneal cancers).  Pap test.** / Every 2 years from ages 90 through 51. Every 3 years starting at age 21 through age 56 or 3 with a history of 3 consecutive normal Pap tests.  HPV screening.** / Every 3 years from ages 24 through ages 1 to 46 with a history of 3 consecutive normal Pap tests.  Hepatitis C blood test.** / For any individual with known risks for hepatitis C.  Skin self-exam. / Monthly.  Influenza vaccine. / Every year.  Tetanus, diphtheria, and acellular pertussis (Tdap, Td) vaccine.** / Consult your health care provider. Pregnant women should receive 1 dose of Tdap vaccine during each pregnancy. 1 dose of Td every 10 years.  Varicella vaccine.** / Consult your health care provider. Pregnant females who do not have evidence of immunity should receive the first dose after pregnancy.  HPV vaccine. / 3 doses over 6 months, if 72 and younger. The vaccine is not recommended for use in pregnant females. However, pregnancy testing is not needed before receiving a dose.  Measles, mumps, rubella (MMR) vaccine.** / You need at least 1 dose of MMR if you were born in 1957 or later. You may also need a 2nd dose. For females of childbearing age, rubella immunity should be determined. If there is no evidence of immunity, females who are not pregnant should be vaccinated. If there is no evidence of immunity, females who are pregnant should delay immunization until after pregnancy.  Pneumococcal 13-valent conjugate (PCV13) vaccine.** / Consult your health care provider.  Pneumococcal polysaccharide (PPSV23) vaccine.** / 1 to 2 doses if you smoke cigarettes or if you have certain conditions.  Meningococcal vaccine.** /  1 dose if you are age 19 to 21 years and a first-year college  student living in a residence hall, or have one of several medical conditions, you need to get vaccinated against meningococcal disease. You may also need additional booster doses.  Hepatitis A vaccine.** / Consult your health care provider.  Hepatitis B vaccine.** / Consult your health care provider.  Haemophilus influenzae type b (Hib) vaccine.** / Consult your health care provider. Ages 40 to 64 years  Blood pressure check.** / Every 1 to 2 years.  Lipid and cholesterol check.** / Every 5 years beginning at age 20 years.  Lung cancer screening. / Every year if you are aged 55-80 years and have a 30-pack-year history of smoking and currently smoke or have quit within the past 15 years. Yearly screening is stopped once you have quit smoking for at least 15 years or develop a health problem that would prevent you from having lung cancer treatment.  Clinical breast exam.** / Every year after age 40 years.  BRCA-related cancer risk assessment.** / For women who have family members with a BRCA-related cancer (breast, ovarian, tubal, or peritoneal cancers).  Mammogram.** / Every year beginning at age 40 years and continuing for as long as you are in good health. Consult with your health care provider.  Pap test.** / Every 3 years starting at age 30 years through age 65 or 70 years with a history of 3 consecutive normal Pap tests.  HPV screening.** / Every 3 years from ages 30 years through ages 65 to 70 years with a history of 3 consecutive normal Pap tests.  Fecal occult blood test (FOBT) of stool. / Every year beginning at age 50 years and continuing until age 75 years. You may not need to do this test if you get a colonoscopy every 10 years.  Flexible sigmoidoscopy or colonoscopy.** / Every 5 years for a flexible sigmoidoscopy or every 10 years for a colonoscopy beginning at age 50 years and continuing until age 75 years.  Hepatitis C blood test.** / For all people born from 1945 through  1965 and any individual with known risks for hepatitis C.  Skin self-exam. / Monthly.  Influenza vaccine. / Every year.  Tetanus, diphtheria, and acellular pertussis (Tdap/Td) vaccine.** / Consult your health care provider. Pregnant women should receive 1 dose of Tdap vaccine during each pregnancy. 1 dose of Td every 10 years.  Varicella vaccine.** / Consult your health care provider. Pregnant females who do not have evidence of immunity should receive the first dose after pregnancy.  Zoster vaccine.** / 1 dose for adults aged 60 years or older.  Measles, mumps, rubella (MMR) vaccine.** / You need at least 1 dose of MMR if you were born in 1957 or later. You may also need a 2nd dose. For females of childbearing age, rubella immunity should be determined. If there is no evidence of immunity, females who are not pregnant should be vaccinated. If there is no evidence of immunity, females who are pregnant should delay immunization until after pregnancy.  Pneumococcal 13-valent conjugate (PCV13) vaccine.** / Consult your health care provider.  Pneumococcal polysaccharide (PPSV23) vaccine.** / 1 to 2 doses if you smoke cigarettes or if you have certain conditions.  Meningococcal vaccine.** / Consult your health care provider.  Hepatitis A vaccine.** / Consult your health care provider.  Hepatitis B vaccine.** / Consult your health care provider.  Haemophilus influenzae type b (Hib) vaccine.** / Consult your health care provider. Ages 65   years and over  Blood pressure check.** / Every 1 to 2 years.  Lipid and cholesterol check.** / Every 5 years beginning at age 20 years.  Lung cancer screening. / Every year if you are aged 55-80 years and have a 30-pack-year history of smoking and currently smoke or have quit within the past 15 years. Yearly screening is stopped once you have quit smoking for at least 15 years or develop a health problem that would prevent you from having lung cancer  treatment.  Clinical breast exam.** / Every year after age 40 years.  BRCA-related cancer risk assessment.** / For women who have family members with a BRCA-related cancer (breast, ovarian, tubal, or peritoneal cancers).  Mammogram.** / Every year beginning at age 40 years and continuing for as long as you are in good health. Consult with your health care provider.  Pap test.** / Every 3 years starting at age 30 years through age 65 or 70 years with 3 consecutive normal Pap tests. Testing can be stopped between 65 and 70 years with 3 consecutive normal Pap tests and no abnormal Pap or HPV tests in the past 10 years.  HPV screening.** / Every 3 years from ages 30 years through ages 65 or 70 years with a history of 3 consecutive normal Pap tests. Testing can be stopped between 65 and 70 years with 3 consecutive normal Pap tests and no abnormal Pap or HPV tests in the past 10 years.  Fecal occult blood test (FOBT) of stool. / Every year beginning at age 50 years and continuing until age 75 years. You may not need to do this test if you get a colonoscopy every 10 years.  Flexible sigmoidoscopy or colonoscopy.** / Every 5 years for a flexible sigmoidoscopy or every 10 years for a colonoscopy beginning at age 50 years and continuing until age 75 years.  Hepatitis C blood test.** / For all people born from 1945 through 1965 and any individual with known risks for hepatitis C.  Osteoporosis screening.** / A one-time screening for women ages 65 years and over and women at risk for fractures or osteoporosis.  Skin self-exam. / Monthly.  Influenza vaccine. / Every year.  Tetanus, diphtheria, and acellular pertussis (Tdap/Td) vaccine.** / 1 dose of Td every 10 years.  Varicella vaccine.** / Consult your health care provider.  Zoster vaccine.** / 1 dose for adults aged 60 years or older.  Pneumococcal 13-valent conjugate (PCV13) vaccine.** / Consult your health care provider.  Pneumococcal  polysaccharide (PPSV23) vaccine.** / 1 dose for all adults aged 65 years and older.  Meningococcal vaccine.** / Consult your health care provider.  Hepatitis A vaccine.** / Consult your health care provider.  Hepatitis B vaccine.** / Consult your health care provider.  Haemophilus influenzae type b (Hib) vaccine.** / Consult your health care provider. ** Family history and personal history of risk and conditions may change your health care provider's recommendations. Document Released: 03/05/2001 Document Revised: 05/24/2013 Document Reviewed: 06/04/2010 ExitCare Patient Information 2015 [employer]. This information is not intended to replace advice given to you by your health care provider. Make sure you discuss any questions you have with your health care provider.  Food Choices for Gastroesophageal Reflux Disease When you have gastroesophageal reflux disease (GERD), the foods you eat and your eating habits are very important. Choosing the right foods can help ease the discomfort of GERD. WHAT GENERAL GUIDELINES DO I NEED TO FOLLOW?  Choose fruits, vegetables, whole grains, low-fat dairy products, and   low-fat meat, fish, and poultry.  Limit fats such as oils, salad dressings, butter, nuts, and avocado.  Keep a food diary to identify foods that cause symptoms.  Avoid foods that cause reflux. These may be different for different people.  Eat frequent small meals instead of three large meals each day.  Eat your meals slowly, in a relaxed setting.  Limit fried foods.  Cook foods using methods other than frying.  Avoid drinking alcohol.  Avoid drinking large amounts of liquids with your meals.  Avoid bending over or lying down until 2-3 hours after eating. WHAT FOODS ARE NOT RECOMMENDED? The following are some foods and drinks that may worsen your symptoms: Vegetables Tomatoes. Tomato juice. Tomato and spaghetti sauce. Chili peppers. Onion and garlic.  Horseradish. Fruits Oranges, grapefruit, and lemon (fruit and juice). Meats High-fat meats, fish, and poultry. This includes hot dogs, ribs, ham, sausage, salami, and bacon. Dairy Whole milk and chocolate milk. Sour cream. Cream. Butter. Ice cream. Cream cheese.  Beverages Coffee and tea, with or without caffeine. Carbonated beverages or energy drinks. Condiments Hot sauce. Barbecue sauce.  Sweets/Desserts Chocolate and cocoa. Donuts. Peppermint and spearmint. Fats and Oils High-fat foods, including French fries and potato chips. Other Vinegar. Strong spices, such as black pepper, white pepper, red pepper, cayenne, curry powder, cloves, ginger, and chili powder. The items listed above may not be a complete list of foods and beverages to avoid. Contact your dietitian for more information. Document Released: 01/07/2005 Document Revised: 01/12/2013 Document Reviewed: 11/11/2012  ExitCare Patient Information 2015 [employer]. This information is not intended to replace advice given to you by your health care provider. Make sure you discuss any questions you have with your health care provider.  

## 2014-06-09 LAB — URINE CULTURE
Colony Count: NO GROWTH
ORGANISM ID, BACTERIA: NO GROWTH

## 2014-06-15 ENCOUNTER — Encounter: Payer: Self-pay | Admitting: Family Medicine

## 2014-06-16 MED ORDER — FENOFIBRIC ACID 105 MG PO TABS: 1.0000 | ORAL_TABLET | Freq: Every day | ORAL | Status: DC

## 2014-06-16 NOTE — Telephone Encounter (Signed)
Tiffany Chessman, DO  [name], CMA           See my chart message. Im not sure I understand what she is asking but we can add fenofibrinic acid 105 mg ----generic  1 po qd #90 1 refills  Recheck labs 3 months

## 2014-07-05 DIAGNOSIS — H43813 Vitreous degeneration, bilateral: Secondary | ICD-10-CM | POA: Diagnosis not present

## 2014-07-05 DIAGNOSIS — H33021 Retinal detachment with multiple breaks, right eye: Secondary | ICD-10-CM | POA: Diagnosis not present

## 2014-07-12 DIAGNOSIS — H33021 Retinal detachment with multiple breaks, right eye: Secondary | ICD-10-CM | POA: Diagnosis not present

## 2014-10-31 DIAGNOSIS — Z23 Encounter for immunization: Secondary | ICD-10-CM | POA: Diagnosis not present

## 2014-12-06 ENCOUNTER — Ambulatory Visit (INDEPENDENT_AMBULATORY_CARE_PROVIDER_SITE_OTHER): Payer: Medicare Other | Admitting: Family Medicine

## 2014-12-06 ENCOUNTER — Encounter: Payer: Self-pay | Admitting: Family Medicine

## 2014-12-06 VITALS — BP 122/74 | HR 50 | Temp 98.1°F | Ht 65.0 in | Wt 145.0 lb

## 2014-12-06 DIAGNOSIS — E785 Hyperlipidemia, unspecified: Secondary | ICD-10-CM

## 2014-12-06 DIAGNOSIS — T23101A Burn of first degree of right hand, unspecified site, initial encounter: Secondary | ICD-10-CM

## 2014-12-06 DIAGNOSIS — I10 Essential (primary) hypertension: Secondary | ICD-10-CM | POA: Diagnosis not present

## 2014-12-06 LAB — COMPREHENSIVE METABOLIC PANEL
ALT: 16 U/L (ref 0–35)
AST: 20 U/L (ref 0–37)
Albumin: 4.3 g/dL (ref 3.5–5.2)
Alkaline Phosphatase: 54 U/L (ref 39–117)
BUN: 13 mg/dL (ref 6–23)
CHLORIDE: 106 meq/L (ref 96–112)
CO2: 30 mEq/L (ref 19–32)
Calcium: 9.9 mg/dL (ref 8.4–10.5)
Creatinine, Ser: 0.73 mg/dL (ref 0.40–1.20)
GFR: 84.64 mL/min (ref >60.00)
Glucose, Bld: 95 mg/dL (ref 70–99)
POTASSIUM: 4.8 meq/L (ref 3.5–5.1)
Sodium: 143 mEq/L (ref 135–145)
Total Bilirubin: 0.8 mg/dL (ref 0.2–1.2)
Total Protein: 7 g/dL (ref 6.0–8.3)

## 2014-12-06 LAB — LIPID PANEL
CHOLESTEROL: 138 mg/dL (ref 0–200)
HDL: 47.5 mg/dL (ref >39.00)
LDL CALC: 61 mg/dL (ref 0–99)
NonHDL: 90.06
Total CHOL/HDL Ratio: 3
Triglycerides: 143 mg/dL (ref 0.0–149.0)
VLDL: 28.6 mg/dL (ref 0.0–40.0)

## 2014-12-06 MED ORDER — METOPROLOL TARTRATE 50 MG PO TABS: ORAL_TABLET | ORAL | Status: DC

## 2014-12-06 MED ORDER — SILVER SULFADIAZINE 1 % EX CREA: 1.0000 "application " | TOPICAL_CREAM | Freq: Every day | CUTANEOUS | Status: DC

## 2014-12-06 NOTE — Progress Notes (Signed)
Pre visit review using our clinic review tool, if applicable. No additional management support is needed unless otherwise documented below in the visit note. 

## 2014-12-06 NOTE — Patient Instructions (Signed)
Hypertension Hypertension, commonly called high blood pressure, is when the force of blood pumping through your arteries is too strong. Your arteries are the blood vessels that carry blood from your heart throughout your body. A blood pressure reading consists of a higher number over a lower number, such as 110/72. The higher number (systolic) is the pressure inside your arteries when your heart pumps. The lower number (diastolic) is the pressure inside your arteries when your heart relaxes. Ideally you want your blood pressure below 120/80. Hypertension forces your heart to work harder to pump blood. Your arteries may become narrow or stiff. Having untreated or uncontrolled hypertension can cause heart attack, stroke, kidney disease, and other problems. RISK FACTORS Some risk factors for high blood pressure are controllable. Others are not.  Risk factors you cannot control include:   Race. You may be at higher risk if you are African American.  Age. Risk increases with age.  Gender. Men are at higher risk than women before age 45 years. After age 65, women are at higher risk than men. Risk factors you can control include:  Not getting enough exercise or physical activity.  Being overweight.  Getting too much fat, sugar, calories, or salt in your diet.  Drinking too much alcohol. SIGNS AND SYMPTOMS Hypertension does not usually cause signs or symptoms. Extremely high blood pressure (hypertensive crisis) may cause headache, anxiety, shortness of breath, and nosebleed. DIAGNOSIS To check if you have hypertension, your health care provider will measure your blood pressure while you are seated, with your arm held at the level of your heart. It should be measured at least twice using the same arm. Certain conditions can cause a difference in blood pressure between your right and left arms. A blood pressure reading that is higher than normal on one occasion does not mean that you need treatment. If  it is not clear whether you have high blood pressure, you may be asked to return on a different day to have your blood pressure checked again. Or, you may be asked to monitor your blood pressure at home for 1 or more weeks. TREATMENT Treating high blood pressure includes making lifestyle changes and possibly taking medicine. Living a healthy lifestyle can help lower high blood pressure. You may need to change some of your habits. Lifestyle changes may include:  Following the DASH diet. This diet is high in fruits, vegetables, and whole grains. It is low in salt, red meat, and added sugars.  Keep your sodium intake below 2,300 mg per day.  Getting at least 30-45 minutes of aerobic exercise at least 4 times per week.  Losing weight if necessary.  Not smoking.  Limiting alcoholic beverages.  Learning ways to reduce stress. Your health care provider may prescribe medicine if lifestyle changes are not enough to get your blood pressure under control, and if one of the following is true:  You are 18-59 years of age and your systolic blood pressure is above 140.  You are 60 years of age or older, and your systolic blood pressure is above 150.  Your diastolic blood pressure is above 90.  You have diabetes, and your systolic blood pressure is over 140 or your diastolic blood pressure is over 90.  You have kidney disease and your blood pressure is above 140/90.  You have heart disease and your blood pressure is above 140/90. Your personal target blood pressure may vary depending on your medical conditions, your age, and other factors. HOME CARE INSTRUCTIONS    Have your blood pressure rechecked as directed by your health care provider.   Take medicines only as directed by your health care provider. Follow the directions carefully. Blood pressure medicines must be taken as prescribed. The medicine does not work as well when you skip doses. Skipping doses also puts you at risk for  problems.  Do not smoke.   Monitor your blood pressure at home as directed by your health care provider. SEEK MEDICAL CARE IF:   You think you are having a reaction to medicines taken.  You have recurrent headaches or feel dizzy.  You have swelling in your ankles.  You have trouble with your vision. SEEK IMMEDIATE MEDICAL CARE IF:  You develop a severe headache or confusion.  You have unusual weakness, numbness, or feel faint.  You have severe chest or abdominal pain.  You vomit repeatedly.  You have trouble breathing. MAKE SURE YOU:   Understand these instructions.  Will watch your condition.  Will get help right away if you are not doing well or get worse.   This information is not intended to replace advice given to you by your health care provider. Make sure you discuss any questions you have with your health care provider.   Document Released: 01/07/2005 Document Revised: 05/24/2014 Document Reviewed: 10/30/2012 Elsevier Interactive Patient Education 2016 Elsevier Inc.  

## 2014-12-06 NOTE — Progress Notes (Signed)
Patient ID: Tiffany Graham, female    DOB: 1950-11-22  Age: 64 y.o. MRN: [id number]    Subjective:  Subjective HPI Tiffany Graham presents for f/u bp and cholesterol.   No c/p, no sob, no palpitations.     Review of Systems  Constitutional: Negative for diaphoresis, appetite change, fatigue and unexpected weight change.  Eyes: Negative for pain, redness and visual disturbance.  Respiratory: Negative for cough, chest tightness, shortness of breath and wheezing.   Cardiovascular: Negative for chest pain, palpitations and leg swelling.  Endocrine: Negative for cold intolerance, heat intolerance, polydipsia, polyphagia and polyuria.  Genitourinary: Negative for dysuria, frequency and difficulty urinating.  Neurological: Negative for dizziness, light-headedness, numbness and headaches.    History Past Medical History  Diagnosis Date  . Hypertension   . Hyperlipidemia     She has past surgical history that includes Appendectomy (1991); Cholecystectomy (2004); Total abdominal hysterectomy (1991); and Tubal ligation (1987).   Her family history includes Asthma in an other family member; Clotting disorder in an other family member; Colon cancer in her father and another family member; Coronary artery disease in an other family member; Hyperlipidemia in an other family member; Hypertension in an other family member; Osteoporosis in an other family member; Thyroid disease in an other family member.She reports that she quit smoking about 33 years ago. She has never used smokeless tobacco. She reports that she drinks about 2.4 oz of alcohol per week. She reports that she does not use illicit drugs.  Current Outpatient Prescriptions on File Prior to Visit  Medication Sig Dispense Refill  . aspirin 81 MG tablet Take 81 mg by mouth daily.    . calcium carbonate (OS-CAL) 600 MG TABS tablet Take 600 mg by mouth 2 (two) times daily with a meal.    . Cholecalciferol (VITAMIN D3) 1000 UNITS CAPS Take by  mouth.    Marland Kitchen glucosamine-chondroitin 500-400 MG tablet Take 1 tablet by mouth 3 (three) times daily.    . Omega-3 Fatty Acids (FISH OIL) 1000 MG CAPS Take 1 capsule by mouth daily.    . simvastatin (ZOCOR) 20 MG tablet Take 1 tablet (20 mg total) by mouth at bedtime. 90 tablet 3   No current facility-administered medications on file prior to visit.     Objective:  Objective Physical Exam  Constitutional: She is oriented to person, place, and time. She appears well-developed and well-nourished.  HENT:  Head: Normocephalic and atraumatic.  Eyes: Conjunctivae and EOM are normal.  Neck: Normal range of motion. Neck supple. No JVD present. Carotid bruit is not present. No thyromegaly present.  Cardiovascular: Normal rate, regular rhythm and normal heart sounds.   No murmur heard. Pulmonary/Chest: Effort normal and breath sounds normal. No respiratory distress. She has no wheezes. She has no rales. She exhibits no tenderness.  Musculoskeletal: She exhibits no edema.  Neurological: She is alert and oriented to person, place, and time.  Skin:     Psychiatric: She has a normal mood and affect. Her behavior is normal.   BP 122/74 mmHg  Pulse 50  Temp(Src) 98.1 F (36.7 C) (Oral)  Ht 5' 5"  (1.651 m)  Wt 145 lb (65.772 kg)  BMI 24.13 kg/m2  SpO2 98% Wt Readings from Last 3 Encounters:  12/06/14 145 lb (65.772 kg)  06/07/14 147 lb (66.679 kg)  12/14/13 148 lb 12.8 oz (67.495 kg)     Lab Results  Component Value Date   WBC 5.2 06/07/2014   HGB 14.0 06/07/2014  HCT 40.3 06/07/2014   PLT 158.0 06/07/2014   GLUCOSE 95 12/06/2014   CHOL 138 12/06/2014   TRIG 143.0 12/06/2014   HDL 47.50 12/06/2014   LDLDIRECT 71.2 12/21/2013   LDLCALC 61 12/06/2014   ALT 16 12/06/2014   AST 20 12/06/2014   NA 143 12/06/2014   K 4.8 12/06/2014   CL 106 12/06/2014   CREATININE 0.73 12/06/2014   BUN 13 12/06/2014   CO2 30 12/06/2014   TSH 0.76 03/16/2009    Mm Digital Screening  Bilateral  12/08/2013  CLINICAL DATA:  Screening. EXAM: DIGITAL SCREENING BILATERAL MAMMOGRAM WITH CAD COMPARISON:  Previous exam(s). ACR Breast Density Category b: There are scattered areas of fibroglandular density. FINDINGS: There are no findings suspicious for malignancy. Images were processed with CAD. IMPRESSION: No mammographic evidence of malignancy. A result letter of this screening mammogram will be mailed directly to the patient. RECOMMENDATION: Screening mammogram in one year. (Code:SM-B-01Y) BI-RADS CATEGORY  1: Negative. Electronically Signed   By: Abelardo Diesel M.D.   On: 12/08/2013 09:23     Assessment & Plan:  Plan I have discontinued Ms. Egli's PREVNAR 13 and Fenofibric Acid. I am also having her start on silver sulfADIAZINE. Additionally, I am having her maintain her aspirin, Fish Oil, glucosamine-chondroitin, Vitamin D3, calcium carbonate, simvastatin, and metoprolol.  Meds ordered this encounter  Medications  . metoprolol (LOPRESSOR) 50 MG tablet    Sig: TAKE 1 TABLET BY MOUTH EVERY MORNING AND 2 TABLETS EVERY EVENING    Dispense:  270 tablet    Refill:  1  . silver sulfADIAZINE (SILVADENE) 1 % cream    Sig: Apply 1 application topically daily.    Dispense:  50 g    Refill:  0    Problem List Items Addressed This Visit    None    Visit Diagnoses    Essential hypertension    -  Primary    Relevant Medications    metoprolol (LOPRESSOR) 50 MG tablet    Other Relevant Orders    Comp Met (CMET) (Completed)    Lipid panel (Completed)    Hyperlipidemia LDL goal <100        Relevant Medications    metoprolol (LOPRESSOR) 50 MG tablet    Other Relevant Orders    Comp Met (CMET) (Completed)    Lipid panel (Completed)    Burn of right hand, first degree, initial encounter        Relevant Medications    silver sulfADIAZINE (SILVADENE) 1 % cream       Follow-up: Return in about 6 months (around 06/05/2015), or if symptoms worsen or fail to improve, for annual exam,  fasting, hypertension, hyperlipidemia.  Garnet Koyanagi, DO

## 2015-02-08 ENCOUNTER — Other Ambulatory Visit: Payer: Self-pay

## 2015-02-08 DIAGNOSIS — Z1231 Encounter for screening mammogram for malignant neoplasm of breast: Secondary | ICD-10-CM

## 2015-02-20 ENCOUNTER — Ambulatory Visit
Admission: RE | Admit: 2015-02-20 | Discharge: 2015-02-20 | Disposition: A | Discharge status: Home / Self Care | Payer: Medicare Other | Source: Ambulatory Visit

## 2015-02-20 DIAGNOSIS — Z1231 Encounter for screening mammogram for malignant neoplasm of breast: Secondary | ICD-10-CM

## 2015-02-21 ENCOUNTER — Telehealth: Payer: Self-pay | Admitting: Family Medicine

## 2015-02-21 DIAGNOSIS — E2839 Other primary ovarian failure: Secondary | ICD-10-CM

## 2015-02-21 NOTE — Telephone Encounter (Signed)
The order is in she can call and schedule it.    KP

## 2015-02-21 NOTE — Telephone Encounter (Signed)
Notified pt. 

## 2015-02-21 NOTE — Telephone Encounter (Signed)
Pt had [facility]ammogram [facility] said that she is due for bone density. Please send them order so she can get it done. Thank you.

## 2015-03-16 ENCOUNTER — Ambulatory Visit
Admission: RE | Admit: 2015-03-16 | Discharge: 2015-03-16 | Disposition: A | Discharge status: Home / Self Care | Payer: Medicare Other | Source: Ambulatory Visit | Attending: Family Medicine | Admitting: Family Medicine

## 2015-03-16 DIAGNOSIS — Z780 Asymptomatic menopausal state: Secondary | ICD-10-CM | POA: Diagnosis not present

## 2015-03-16 DIAGNOSIS — E2839 Other primary ovarian failure: Secondary | ICD-10-CM

## 2015-03-25 DIAGNOSIS — R509 Fever, unspecified: Secondary | ICD-10-CM | POA: Diagnosis not present

## 2015-03-25 DIAGNOSIS — A084 Viral intestinal infection, unspecified: Secondary | ICD-10-CM | POA: Diagnosis not present

## 2015-04-18 ENCOUNTER — Ambulatory Visit (INDEPENDENT_AMBULATORY_CARE_PROVIDER_SITE_OTHER): Payer: Medicare Other | Admitting: Family

## 2015-04-18 ENCOUNTER — Encounter: Payer: Self-pay | Admitting: Family

## 2015-04-18 VITALS — BP 134/84 | HR 56 | Temp 98.7°F | Resp 18 | Ht 65.0 in | Wt 145.4 lb

## 2015-04-18 DIAGNOSIS — R300 Dysuria: Secondary | ICD-10-CM | POA: Diagnosis not present

## 2015-04-18 DIAGNOSIS — J019 Acute sinusitis, unspecified: Secondary | ICD-10-CM

## 2015-04-18 DIAGNOSIS — N3000 Acute cystitis without hematuria: Secondary | ICD-10-CM

## 2015-04-18 LAB — POC URINALSYSI DIPSTICK (AUTOMATED)
BILIRUBIN UA: NEGATIVE
GLUCOSE UA: NEGATIVE
Ketones, UA: NEGATIVE
NITRITE UA: NEGATIVE
Protein, UA: NEGATIVE
SPEC GRAV UA: 1.02
Urobilinogen, UA: NEGATIVE
pH, UA: 6

## 2015-04-18 MED ORDER — CEFDINIR 300 MG PO CAPS: 300.0000 mg | ORAL_CAPSULE | Freq: Two times a day (BID) | ORAL | Status: DC

## 2015-04-18 NOTE — Progress Notes (Signed)
Pre visit review using our clinic review tool, if applicable. No additional management support is needed unless otherwise documented below in the visit note. 

## 2015-04-18 NOTE — Patient Instructions (Signed)
Add flonase 2 sprays to each nostril once daily. Add claritin 13m once daily. For cough you can use Delsym which is available OTC. For sinus infection and UTI, please start cefdinir. Call if symptoms worsen, or if symptoms are not improved in 3 days.

## 2015-04-18 NOTE — Progress Notes (Signed)
Subjective:    Patient ID: Tiffany Graham, female    DOB: October 03, 1950, 65 y.o.   MRN: [id number]  HPI  [name] is a 65 yr old female who presents today with chief complaint of dysuria.  Reports that she had dysuria yesterday. she reports ongoing dysuria.  + frequency, no gross hematuria. + R sided low back pain. She denies known fever.  Has had sweats/chills. Overall energy is "terrible."    Reports intermittent cough x 2 weeks. She reports symptoms began as sinus drainage. Initially resolved.   cough x 2 weeks.  Cough is intermittent.  + facial pressure. Using advil cold/sinus without much improvement.   Review of Systems See HPI  Past Medical History  Diagnosis Date  . Hypertension   . Hyperlipidemia     Social History   Social History  . Marital Status: Married    Spouse Name: N/A  . Number of Children: N/A  . Years of Education: N/A   Occupational History  . Not on file.   Social History Main Topics  . Smoking status: Former Smoker    Quit date: 01/21/1981  . Smokeless tobacco: Never Used  . Alcohol Use: 2.4 oz/week    4 Glasses of wine per week     Comment: 1 bottle of wine per weekend  . Drug Use: No  . Sexual Activity: Not on file   Other Topics Concern  . Not on file   Social History Narrative    Past Surgical History  Procedure Laterality Date  . Appendectomy  1991  . Cholecystectomy  2004  . Total abdominal hysterectomy  1991    with BSO, secondary fibroids  . Tubal ligation  1987    first one 1974, then reversed 1983    Family History  Problem Relation Age of Onset  . Colon cancer    . Coronary artery disease    . Asthma    . Hyperlipidemia    . Hypertension    . Osteoporosis    . Thyroid disease    . Clotting disorder      thromboembolism  . Colon cancer Father     Allergies  Allergen Reactions  . Triprolidine-Pseudoephedrine Hives    Ingredient in Actifed    Current Outpatient Prescriptions on File Prior to Visit  Medication  Sig Dispense Refill  . aspirin 81 MG tablet Take 81 mg by mouth daily.    . calcium carbonate (OS-CAL) 600 MG TABS tablet Take 600 mg by mouth 2 (two) times daily with a meal.    . Cholecalciferol (VITAMIN D3) 1000 UNITS CAPS Take by mouth.    Marland Kitchen glucosamine-chondroitin 500-400 MG tablet Take 1 tablet by mouth 3 (three) times daily.    . metoprolol (LOPRESSOR) 50 MG tablet TAKE 1 TABLET BY MOUTH EVERY MORNING AND 2 TABLETS EVERY EVENING 270 tablet 1  . Omega-3 Fatty Acids (FISH OIL) 1000 MG CAPS Take 1 capsule by mouth daily.    . simvastatin (ZOCOR) 20 MG tablet Take 1 tablet (20 mg total) by mouth at bedtime. 90 tablet 3   No current facility-administered medications on file prior to visit.    BP 134/84 mmHg  Pulse 56  Temp(Src) 98.7 F (37.1 C) (Oral)  Resp 18  Ht 5' 5"  (1.651 m)  Wt 145 lb 6.4 oz (65.953 kg)  BMI 24.20 kg/m2  SpO2 98%       Objective:   Physical Exam  Constitutional: She is oriented to person, place,  and time. She appears well-developed and well-nourished.  HENT:  Head: Normocephalic and atraumatic.  Right Ear: Tympanic membrane and ear canal normal.  Left Ear: Tympanic membrane and ear canal normal.  Mouth/Throat: No oropharyngeal exudate, posterior oropharyngeal edema or posterior oropharyngeal erythema.  Cardiovascular: Normal rate, regular rhythm and normal heart sounds.   No murmur heard. Pulmonary/Chest: Effort normal and breath sounds normal. No respiratory distress. She has no wheezes.  Abdominal: There is no tenderness. There is no CVA tenderness.  Musculoskeletal: She exhibits no edema.  Neurological: She is alert and oriented to person, place, and time.  Psychiatric: She has a normal mood and affect. Her behavior is normal. Judgment and thought content normal.          Assessment & Plan:  UTI-  UA shows moderate leuks. Will send for urine culture. Advised pt to begin cefdinir.  Sinusitis- rx with cefdinir for urine and sinus coverage.  Also advised pt to add claritin and flonase.

## 2015-04-20 ENCOUNTER — Encounter: Payer: Self-pay | Admitting: Family

## 2015-04-20 LAB — URINE CULTURE

## 2015-05-15 ENCOUNTER — Telehealth: Payer: Self-pay | Admitting: Family Medicine

## 2015-05-15 DIAGNOSIS — E785 Hyperlipidemia, unspecified: Secondary | ICD-10-CM

## 2015-05-15 DIAGNOSIS — I10 Essential (primary) hypertension: Secondary | ICD-10-CM

## 2015-05-15 MED ORDER — METOPROLOL TARTRATE 50 MG PO TABS: ORAL_TABLET | ORAL | Status: DC

## 2015-05-15 MED ORDER — SIMVASTATIN 20 MG PO TABS: 20.0000 mg | ORAL_TABLET | Freq: Every day | ORAL | Status: DC

## 2015-05-15 NOTE — Telephone Encounter (Signed)
Please advise if she can have her CPE with someone lese.    KP

## 2015-05-15 NOTE — Telephone Encounter (Signed)
Can be reached: [phone] Pharmacy: [facility] [address], [place]  Reason for call: Pt called for refills on metoprolol and simvastatin. She has about 6 weeks on hand. Advised pt next CPE with Dr. Etter Sjogren in August. She stated that she would like to have CPE with PA if Dr. Etter Sjogren will approve because she is due in May. Please advise.

## 2015-05-15 NOTE — Telephone Encounter (Signed)
Fine with me but there really is no rush

## 2015-05-16 NOTE — Telephone Encounter (Signed)
Scheduled with Percell Miller at pt request

## 2015-06-28 ENCOUNTER — Telehealth: Payer: Self-pay

## 2015-06-28 ENCOUNTER — Ambulatory Visit (INDEPENDENT_AMBULATORY_CARE_PROVIDER_SITE_OTHER): Payer: Medicare Other | Admitting: Medical

## 2015-06-28 ENCOUNTER — Encounter: Payer: Self-pay | Admitting: Medical

## 2015-06-28 ENCOUNTER — Telehealth: Payer: Self-pay | Admitting: Medical

## 2015-06-28 VITALS — BP 120/84 | HR 60 | Temp 98.2°F | Ht 65.0 in | Wt 141.4 lb

## 2015-06-28 DIAGNOSIS — R634 Abnormal weight loss: Secondary | ICD-10-CM

## 2015-06-28 DIAGNOSIS — Z0000 Encounter for general adult medical examination without abnormal findings: Secondary | ICD-10-CM

## 2015-06-28 DIAGNOSIS — R82998 Other abnormal findings in urine: Secondary | ICD-10-CM

## 2015-06-28 DIAGNOSIS — Z1211 Encounter for screening for malignant neoplasm of colon: Secondary | ICD-10-CM

## 2015-06-28 DIAGNOSIS — E785 Hyperlipidemia, unspecified: Secondary | ICD-10-CM

## 2015-06-28 DIAGNOSIS — N390 Urinary tract infection, site not specified: Secondary | ICD-10-CM | POA: Diagnosis not present

## 2015-06-28 DIAGNOSIS — I10 Essential (primary) hypertension: Secondary | ICD-10-CM | POA: Diagnosis not present

## 2015-06-28 DIAGNOSIS — Z0189 Encounter for other specified special examinations: Secondary | ICD-10-CM

## 2015-06-28 LAB — LIPID PANEL
CHOL/HDL RATIO: 3
Cholesterol: 154 mg/dL (ref 0–200)
HDL: 56.2 mg/dL (ref >39.00)
LDL CALC: 69 mg/dL (ref 0–99)
NonHDL: 97.8
TRIGLYCERIDES: 146 mg/dL (ref 0.0–149.0)
VLDL: 29.2 mg/dL (ref 0.0–40.0)

## 2015-06-28 LAB — CBC WITH DIFFERENTIAL/PLATELET
BASOS ABS: 0 10*3/uL (ref 0.0–0.1)
BASOS PCT: 0.4 % (ref 0.0–3.0)
Eosinophils Absolute: 0.1 10*3/uL (ref 0.0–0.7)
Eosinophils Relative: 1.9 % (ref 0.0–5.0)
HEMATOCRIT: 41.2 % (ref 36.0–46.0)
Hemoglobin: 14.1 g/dL (ref 12.0–15.0)
LYMPHS PCT: 32.4 % (ref 12.0–46.0)
Lymphs Abs: 2 10*3/uL (ref 0.7–4.0)
MCHC: 34.2 g/dL (ref 30.0–36.0)
MCV: 88.9 fl (ref 78.0–100.0)
MONOS PCT: 9 % (ref 3.0–12.0)
Monocytes Absolute: 0.6 10*3/uL (ref 0.1–1.0)
NEUTROS ABS: 3.5 10*3/uL (ref 1.4–7.7)
Neutrophils Relative %: 56.3 % (ref 43.0–77.0)
PLATELETS: 206 10*3/uL (ref 150.0–400.0)
RBC: 4.64 Mil/uL (ref 3.87–5.11)
RDW: 13.1 % (ref 11.5–15.5)
WBC: 6.2 10*3/uL (ref 4.0–10.5)

## 2015-06-28 LAB — POC URINALSYSI DIPSTICK (AUTOMATED)
BILIRUBIN UA: NEGATIVE
GLUCOSE UA: NEGATIVE
Ketones, UA: NEGATIVE
Nitrite, UA: NEGATIVE
Protein, UA: NEGATIVE
RBC UA: NEGATIVE
SPEC GRAV UA: 1.025
Urobilinogen, UA: 0.2
pH, UA: 6

## 2015-06-28 LAB — COMPREHENSIVE METABOLIC PANEL
ALT: 12 U/L (ref 0–35)
AST: 19 U/L (ref 0–37)
Albumin: 4.4 g/dL (ref 3.5–5.2)
Alkaline Phosphatase: 53 U/L (ref 39–117)
BUN: 17 mg/dL (ref 6–23)
CALCIUM: 10.5 mg/dL (ref 8.4–10.5)
CHLORIDE: 105 meq/L (ref 96–112)
CO2: 32 meq/L (ref 19–32)
CREATININE: 0.8 mg/dL (ref 0.40–1.20)
GFR: 76.02 mL/min (ref >60.00)
Glucose, Bld: 89 mg/dL (ref 70–99)
Potassium: 5 mEq/L (ref 3.5–5.1)
Sodium: 142 mEq/L (ref 135–145)
Total Bilirubin: 0.5 mg/dL (ref 0.2–1.2)
Total Protein: 7.4 g/dL (ref 6.0–8.3)

## 2015-06-28 LAB — TSH: TSH: 1.05 u[IU]/mL (ref 0.35–4.50)

## 2015-06-28 MED ORDER — AMOXICILLIN 875 MG PO TABS: 875.0000 mg | ORAL_TABLET | Freq: Two times a day (BID) | ORAL | Status: DC

## 2015-06-28 MED ORDER — FLUTICASONE PROPIONATE 50 MCG/ACT NA SUSP: 2.0000 | Freq: Every day | NASAL | Status: DC

## 2015-06-28 NOTE — Addendum Note (Signed)
Addended by: Caffie Pinto on: 06/28/2015 10:55 AM   Modules accepted: Orders

## 2015-06-28 NOTE — Telephone Encounter (Signed)
I did call pharmacy. Cancelled amoxicillin sent by accident. I never sent benzonatate to pharmacy per tech at [facility] sure did not try to fill benzonatate.

## 2015-06-28 NOTE — Addendum Note (Signed)
Addended by: Anabel Halon on: 06/28/2015 09:28 AM   Modules accepted: Orders, SmartSet

## 2015-06-28 NOTE — Assessment & Plan Note (Signed)
Cbc, cmp, tsh, lipid, ua today. ifob as well.

## 2015-06-28 NOTE — Telephone Encounter (Signed)
I advise that the baby boomers get the test done.  If she would like it I would advise check ing with her insurance because some ins will not pay for it

## 2015-06-28 NOTE — Telephone Encounter (Signed)
Spoke with pt and she wants to know if the Hep C screening is recommended by Dr. Carollee Herter. I advised the pt that she would need to call her insurance to verify that they would cover the test and I advised her that she would need to contact her insurance and she wanted to see if anyone in the office knew about that coverage. I again tried to explain that she would need to call her insurance. Please advise.

## 2015-06-28 NOTE — Progress Notes (Signed)
Pre visit review using our clinic review tool, if applicable. No additional management support is needed unless otherwise documented below in the visit note. 

## 2015-06-28 NOTE — Progress Notes (Addendum)
Subjective:    Patient ID: Tiffany Graham, female    DOB: November 01, 1950, 65 y.o.   MRN: [id number]  HPI  I have reviewed pt PMH, PSH, FH, Social History and Surgical History  Pt exercises 3 times a week, eats healthy diet. Non smoker.  Pt state had physical done last year.  Pt is up to date on tdap, mammogram, colonosocpy, and zostavax. Dexascan completed.  BMI done today.  Pt describes good mood. Denies any depression. No falls.   On review no memory problems that she notes. Serial 7 ok, spells world backwards well. Recall 3 objects fine.  Pt can climb stairs well. Take showers well independently. She goes to gym 3 time a week. Does elipitical. Weights and balancing.   Pt does not see any specialist except used to see derm every year.   Pt has lost 10 pounds in past 9 months. Not trying to lose weight. Pt has dropped carbs at night.  Some sinus pressure symptoms this past spring. Occasional pnd. Rare cough. Pt cxr normal in 2012.          Review of Systems  Constitutional: Negative for fever, chills and fatigue.  HENT: Positive for congestion. Negative for nosebleeds, postnasal drip, sinus pressure and sore throat.   Respiratory: Negative for cough.   Cardiovascular: Negative for chest pain and palpitations.  Gastrointestinal: Negative for abdominal pain and blood in stool.  Genitourinary: Negative for dysuria and flank pain.  Musculoskeletal: Negative for myalgias and back pain.  Skin: Negative for rash.  Neurological: Negative for dizziness, numbness and headaches.  Hematological: Negative for adenopathy. Does not bruise/bleed easily.  Psychiatric/Behavioral: Negative for behavioral problems, confusion and dysphoric mood.     Past Medical History  Diagnosis Date  . Hypertension   . Hyperlipidemia      Social History   Social History  . Marital Status: Married    Spouse Name: N/A  . Number of Children: N/A  . Years of Education: N/A   Occupational  History  . Not on file.   Social History Main Topics  . Smoking status: Former Smoker    Quit date: 01/21/1981  . Smokeless tobacco: Never Used  . Alcohol Use: 2.4 oz/week    4 Glasses of wine per week     Comment: 1 bottle of wine per weekend  . Drug Use: No  . Sexual Activity: Not on file   Other Topics Concern  . Not on file   Social History Narrative    Past Surgical History  Procedure Laterality Date  . Appendectomy  1991  . Cholecystectomy  2004  . Total abdominal hysterectomy  1991    with BSO, secondary fibroids  . Tubal ligation  1987    first one 1974, then reversed 1983    Family History  Problem Relation Age of Onset  . Colon cancer    . Coronary artery disease    . Asthma    . Hyperlipidemia    . Hypertension    . Osteoporosis    . Thyroid disease    . Clotting disorder      thromboembolism  . Colon cancer Father     Allergies  Allergen Reactions  . Triprolidine-Pseudoephedrine Hives    Ingredient in Actifed    Current Outpatient Prescriptions on File Prior to Visit  Medication Sig Dispense Refill  . aspirin 81 MG tablet Take 81 mg by mouth daily.    . calcium carbonate (OS-CAL) 600 MG TABS tablet  Take 600 mg by mouth 2 (two) times daily with a meal.    . cefdinir (OMNICEF) 300 MG capsule Take 1 capsule (300 mg total) by mouth 2 (two) times daily. 20 capsule 0  . Cholecalciferol (VITAMIN D3) 1000 UNITS CAPS Take by mouth.    Marland Kitchen glucosamine-chondroitin 500-400 MG tablet Take 1 tablet by mouth 3 (three) times daily.    . metoprolol (LOPRESSOR) 50 MG tablet TAKE 1 TABLET BY MOUTH EVERY MORNING AND 2 TABLETS EVERY EVENING 270 tablet 0  . Omega-3 Fatty Acids (FISH OIL) 1000 MG CAPS Take 1 capsule by mouth daily.    . simvastatin (ZOCOR) 20 MG tablet Take 1 tablet (20 mg total) by mouth at bedtime. 90 tablet 0   No current facility-administered medications on file prior to visit.    There were no vitals taken for this visit.       Objective:     Physical Exam   General Mental Status- Alert. General Appearance- Not in acute distress.   Skin General: Color- Normal Color. Moisture- Normal Moisture.  Neck Carotid Arteries- Normal color. Moisture- Normal Moisture. No carotid bruits. No JVD.  Chest and Lung Exam Auscultation: Breath Sounds:-Normal.  Cardiovascular Auscultation:Rythm- Regular. Murmurs & Other Heart Sounds:Auscultation of the heart reveals- No Murmurs.  Abdomen Inspection:-Inspeection Normal. Palpation/Percussion:Note:No mass. Palpation and Percussion of the abdomen reveal- Non Tender, Non Distended + BS, no rebound or guarding.   Neurologic Cranial Nerve exam:- CN III-XII intact(No nystagmus), symmetric smile. Strength:- 5/5 equal and symmetric strength both upper and lower extremities.  Skin- scattered small freckles all over. Ocasional small area that feels dry.        Assessment & Plan:  Would recommend continue healthy diet and exercise.  See dermatologist every year due to skin type. We can refer if you want.  Will get ifob to evaluate your weight loss. Seems normal variant loss related to cutting back on carbs but if loss continue with no effort notify us and expand work up.  Will rx flonase for allergies. If your cough worsens again will get cxr. Just call and notify me.   Follow up in 1 year or as needed

## 2015-06-28 NOTE — Patient Instructions (Addendum)
Wellness examination Cbc, cmp, tsh, lipid, ua today. ifob as well.   Would recommend continue healthy diet and exercise.  See dermatologist every year due to skin type. We can refer if you want.  Will get ifob to evaluate your weight loss. Seems normal variant loss related to cutting back on carbs but if loss continue with no effort notify us and expand work up.  Will rx flonase for allergies. If your cough worsens again will get cxr. Just call and notify me.   Follow up in 1 year or as needed(as scheduled with pcp as well)  Preventive Care for Adults, Female A healthy lifestyle and preventive care can promote health and wellness. Preventive health guidelines for women include the following key practices.  A routine yearly physical is a good way to check with your health care provider about your health and preventive screening. It is a chance to share any concerns and updates on your health and to receive a thorough exam.  Visit your dentist for a routine exam and preventive care every 6 months. Brush your teeth twice a day and floss once a day. Good oral hygiene prevents tooth decay and gum disease.  The frequency of eye exams is based on your age, health, family medical history, use of contact lenses, and other factors. Follow your health care provider's recommendations for frequency of eye exams.  Eat a healthy diet. Foods like vegetables, fruits, whole grains, low-fat dairy products, and lean protein foods contain the nutrients you need without too many calories. Decrease your intake of foods high in solid fats, added sugars, and salt. Eat the right amount of calories for you.Get information about a proper diet from your health care provider, if necessary.  Regular physical exercise is one of the most important things you can do for your health. Most adults should get at least 150 minutes of moderate-intensity exercise (any activity that increases your heart rate and causes you to  sweat) each week. In addition, most adults need muscle-strengthening exercises on 2 or more days a week.  Maintain a healthy weight. The body mass index (BMI) is a screening tool to identify possible weight problems. It provides an estimate of body fat based on height and weight. Your health care provider can find your BMI and can help you achieve or maintain a healthy weight.For adults 20 years and older:  A BMI below 18.5 is considered underweight.  A BMI of 18.5 to 24.9 is normal.  A BMI of 25 to 29.9 is considered overweight.  A BMI of 30 and above is considered obese.  Maintain normal blood lipids and cholesterol levels by exercising and minimizing your intake of saturated fat. Eat a balanced diet with plenty of fruit and vegetables. Blood tests for lipids and cholesterol should begin at age 74 and be repeated every 5 years. If your lipid or cholesterol levels are high, you are over 50, or you are at high risk for heart disease, you may need your cholesterol levels checked more frequently.Ongoing high lipid and cholesterol levels should be treated with medicines if diet and exercise are not working.  If you smoke, find out from your health care provider how to quit. If you do not use tobacco, do not start.  Lung cancer screening is recommended for adults aged 31-80 years who are at high risk for developing lung cancer because of a history of smoking. A yearly low-dose CT scan of the lungs is recommended for people who have at least  a 30-pack-year history of smoking and are a current smoker or have quit within the past 15 years. A pack year of smoking is smoking an average of 1 pack of cigarettes a day for 1 year (for example: 1 pack a day for 30 years or 2 packs a day for 15 years). Yearly screening should continue until the smoker has stopped smoking for at least 15 years. Yearly screening should be stopped for people who develop a health problem that would prevent them from having lung  cancer treatment.  If you are pregnant, do not drink alcohol. If you are breastfeeding, be very cautious about drinking alcohol. If you are not pregnant and choose to drink alcohol, do not have more than 1 drink per day. One drink is considered to be 12 ounces (355 mL) of beer, 5 ounces (148 mL) of wine, or 1.5 ounces (44 mL) of liquor.  Avoid use of street drugs. Do not share needles with anyone. Ask for help if you need support or instructions about stopping the use of drugs.  High blood pressure causes heart disease and increases the risk of stroke. Your blood pressure should be checked at least every 1 to 2 years. Ongoing high blood pressure should be treated with medicines if weight loss and exercise do not work.  If you are 55-61 years old, ask your health care provider if you should take aspirin to prevent strokes.  Diabetes screening is done by taking a blood sample to check your blood glucose level after you have not eaten for a certain period of time (fasting). If you are not overweight and you do not have risk factors for diabetes, you should be screened once every 3 years starting at age 74. If you are overweight or obese and you are 55-13 years of age, you should be screened for diabetes every year as part of your cardiovascular risk assessment.  Breast cancer screening is essential preventive care for women. You should practice "breast self-awareness." This means understanding the normal appearance and feel of your breasts and may include breast self-examination. Any changes detected, no matter how small, should be reported to a health care provider. Women in their 53s and 30s should have a clinical breast exam (CBE) by a health care provider as part of a regular health exam every 1 to 3 years. After age 27, women should have a CBE every year. Starting at age 59, women should consider having a mammogram (breast X-ray test) every year. Women who have a family history of breast cancer should  talk to their health care provider about genetic screening. Women at a high risk of breast cancer should talk to their health care providers about having an MRI and a mammogram every year.  Breast cancer gene (BRCA)-related cancer risk assessment is recommended for women who have family members with BRCA-related cancers. BRCA-related cancers include breast, ovarian, tubal, and peritoneal cancers. Having family members with these cancers may be associated with an increased risk for harmful changes (mutations) in the breast cancer genes BRCA1 and BRCA2. Results of the assessment will determine the need for genetic counseling and BRCA1 and BRCA2 testing.  Your health care provider may recommend that you be screened regularly for cancer of the pelvic organs (ovaries, uterus, and vagina). This screening involves a pelvic examination, including checking for microscopic changes to the surface of your cervix (Pap test). You may be encouraged to have this screening done every 3 years, beginning at age 53.  For women  ages 17-65, health care providers may recommend pelvic exams and Pap testing every 3 years, or they may recommend the Pap and pelvic exam, combined with testing for human papilloma virus (HPV), every 5 years. Some types of HPV increase your risk of cervical cancer. Testing for HPV may also be done on women of any age with unclear Pap test results.  Other health care providers may not recommend any screening for nonpregnant women who are considered low risk for pelvic cancer and who do not have symptoms. Ask your health care provider if a screening pelvic exam is right for you.  If you have had past treatment for cervical cancer or a condition that could lead to cancer, you need Pap tests and screening for cancer for at least 20 years after your treatment. If Pap tests have been discontinued, your risk factors (such as having a new sexual partner) need to be reassessed to determine if screening should  resume. Some women have medical problems that increase the chance of getting cervical cancer. In these cases, your health care provider may recommend more frequent screening and Pap tests.  Colorectal cancer can be detected and often prevented. Most routine colorectal cancer screening begins at the age of 24 years and continues through age 83 years. However, your health care provider may recommend screening at an earlier age if you have risk factors for colon cancer. On a yearly basis, your health care provider may provide home test kits to check for hidden blood in the stool. Use of a small camera at the end of a tube, to directly examine the colon (sigmoidoscopy or colonoscopy), can detect the earliest forms of colorectal cancer. Talk to your health care provider about this at age 31, when routine screening begins. Direct exam of the colon should be repeated every 5-10 years through age 10 years, unless early forms of precancerous polyps or small growths are found.  People who are at an increased risk for hepatitis B should be screened for this virus. You are considered at high risk for hepatitis B if:  You were born in a country where hepatitis B occurs often. Talk with your health care provider about which countries are considered high risk.  Your parents were born in a high-risk country and you have not received a shot to protect against hepatitis B (hepatitis B vaccine).  You have HIV or AIDS.  You use needles to inject street drugs.  You live with, or have sex with, someone who has hepatitis B.  You get hemodialysis treatment.  You take certain medicines for conditions like cancer, organ transplantation, and autoimmune conditions.  Hepatitis C blood testing is recommended for all people born from [birth year] through 1967 and any individual with known risks for hepatitis C.  Practice safe sex. Use condoms and avoid high-risk sexual practices to reduce the spread of sexually transmitted  infections (STIs). STIs include gonorrhea, chlamydia, syphilis, trichomonas, herpes, HPV, and human immunodeficiency virus (HIV). Herpes, HIV, and HPV are viral illnesses that have no cure. They can result in disability, cancer, and death.  You should be screened for sexually transmitted illnesses (STIs) including gonorrhea and chlamydia if:  You are sexually active and are younger than 24 years.  You are older than 24 years and your health care provider tells you that you are at risk for this type of infection.  Your sexual activity has changed since you were last screened and you are at an increased risk for chlamydia or gonorrhea. Ask  your health care provider if you are at risk.  If you are at risk of being infected with HIV, it is recommended that you take a prescription medicine daily to prevent HIV infection. This is called preexposure prophylaxis (PrEP). You are considered at risk if:  You are sexually active and do not regularly use condoms or know the HIV status of your partner(s).  You take drugs by injection.  You are sexually active with a partner who has HIV.  Talk with your health care provider about whether you are at high risk of being infected with HIV. If you choose to begin PrEP, you should first be tested for HIV. You should then be tested every 3 months for as long as you are taking PrEP.  Osteoporosis is a disease in which the bones lose minerals and strength with aging. This can result in serious bone fractures or breaks. The risk of osteoporosis can be identified using a bone density scan. Women ages 62 years and over and women at risk for fractures or osteoporosis should discuss screening with their health care providers. Ask your health care provider whether you should take a calcium supplement or vitamin D to reduce the rate of osteoporosis.  Menopause can be associated with physical symptoms and risks. Hormone replacement therapy is available to decrease symptoms  and risks. You should talk to your health care provider about whether hormone replacement therapy is right for you.  Use sunscreen. Apply sunscreen liberally and repeatedly throughout the day. You should seek shade when your shadow is shorter than you. Protect yourself by wearing long sleeves, pants, a wide-brimmed hat, and sunglasses year round, whenever you are outdoors.  Once a month, do a whole body skin exam, using a mirror to look at the skin on your back. Tell your health care provider of new moles, moles that have irregular borders, moles that are larger than a pencil eraser, or moles that have changed in shape or color.  Stay current with required vaccines (immunizations).  Influenza vaccine. All adults should be immunized every year.  Tetanus, diphtheria, and acellular pertussis (Td, Tdap) vaccine. Pregnant women should receive 1 dose of Tdap vaccine during each pregnancy. The dose should be obtained regardless of the length of time since the last dose. Immunization is preferred during the 27th-36th week of gestation. An adult who has not previously received Tdap or who does not know her vaccine status should receive 1 dose of Tdap. This initial dose should be followed by tetanus and diphtheria toxoids (Td) booster doses every 10 years. Adults with an unknown or incomplete history of completing a 3-dose immunization series with Td-containing vaccines should begin or complete a primary immunization series including a Tdap dose. Adults should receive a Td booster every 10 years.  Varicella vaccine. An adult without evidence of immunity to varicella should receive 2 doses or a second dose if she has previously received 1 dose. Pregnant females who do not have evidence of immunity should receive the first dose after pregnancy. This first dose should be obtained before leaving the health care facility. The second dose should be obtained 4-8 weeks after the first dose.  Human papillomavirus (HPV)  vaccine. Females aged 13-26 years who have not received the vaccine previously should obtain the 3-dose series. The vaccine is not recommended for use in pregnant females. However, pregnancy testing is not needed before receiving a dose. If a female is found to be pregnant after receiving a dose, no treatment is needed.  In that case, the remaining doses should be delayed until after the pregnancy. Immunization is recommended for any person with an immunocompromised condition through the age of 45 years if she did not get any or all doses earlier. During the 3-dose series, the second dose should be obtained 4-8 weeks after the first dose. The third dose should be obtained 24 weeks after the first dose and 16 weeks after the second dose.  Zoster vaccine. One dose is recommended for adults aged 55 years or older unless certain conditions are present.  Measles, mumps, and rubella (MMR) vaccine. Adults born before [birth year] generally are considered immune to measles and mumps. Adults born in [birth year] or later should have 1 or more doses of MMR vaccine unless there is a contraindication to the vaccine or there is laboratory evidence of immunity to each of the three diseases. A routine second dose of MMR vaccine should be obtained at least 28 days after the first dose for students attending postsecondary schools, health care workers, or international travelers. People who received inactivated measles vaccine or an unknown type of measles vaccine during 1963-1967 should receive 2 doses of MMR vaccine. People who received inactivated mumps vaccine or an unknown type of mumps vaccine before 1979 and are at high risk for mumps infection should consider immunization with 2 doses of MMR vaccine. For females of childbearing age, rubella immunity should be determined. If there is no evidence of immunity, females who are not pregnant should be vaccinated. If there is no evidence of immunity, females who are pregnant should delay  immunization until after pregnancy. Unvaccinated health care workers born before [birth year] who lack laboratory evidence of measles, mumps, or rubella immunity or laboratory confirmation of disease should consider measles and mumps immunization with 2 doses of MMR vaccine or rubella immunization with 1 dose of MMR vaccine.  Pneumococcal 13-valent conjugate (PCV13) vaccine. When indicated, a person who is uncertain of his immunization history and has no record of immunization should receive the PCV13 vaccine. All adults 48 years of age and older should receive this vaccine. An adult aged 62 years or older who has certain medical conditions and has not been previously immunized should receive 1 dose of PCV13 vaccine. This PCV13 should be followed with a dose of pneumococcal polysaccharide (PPSV23) vaccine. Adults who are at high risk for pneumococcal disease should obtain the PPSV23 vaccine at least 8 weeks after the dose of PCV13 vaccine. Adults older than 67 years of age who have normal immune system function should obtain the PPSV23 vaccine dose at least 1 year after the dose of PCV13 vaccine.  Pneumococcal polysaccharide (PPSV23) vaccine. When PCV13 is also indicated, PCV13 should be obtained first. All adults aged 25 years and older should be immunized. An adult younger than age 67 years who has certain medical conditions should be immunized. Any person who resides in a nursing home or long-term care facility should be immunized. An adult smoker should be immunized. People with an immunocompromised condition and certain other conditions should receive both PCV13 and PPSV23 vaccines. People with human immunodeficiency virus (HIV) infection should be immunized as soon as possible after diagnosis. Immunization during chemotherapy or radiation therapy should be avoided. Routine use of PPSV23 vaccine is not recommended for American Indians, [place] Natives, or people younger than 65 years unless there are medical  conditions that require PPSV23 vaccine. When indicated, people who have unknown immunization and have no record of immunization should receive PPSV23 vaccine. One-time revaccination 5 years  after the first dose of PPSV23 is recommended for people aged 19-64 years who have chronic kidney failure, nephrotic syndrome, asplenia, or immunocompromised conditions. People who received 1-2 doses of PPSV23 before age 57 years should receive another dose of PPSV23 vaccine at age 31 years or later if at least 5 years have passed since the previous dose. Doses of PPSV23 are not needed for people immunized with PPSV23 at or after age 53 years.  Meningococcal vaccine. Adults with asplenia or persistent complement component deficiencies should receive 2 doses of quadrivalent meningococcal conjugate (MenACWY-D) vaccine. The doses should be obtained at least 2 months apart. Microbiologists working with certain meningococcal bacteria, [employer] recruits, people at risk during an outbreak, and people who travel to or live in countries with a high rate of meningitis should be immunized. A first-year college student up through age 63 years who is living in a residence hall should receive a dose if she did not receive a dose on or after her 16th birthday. Adults who have certain high-risk conditions should receive one or more doses of vaccine.  Hepatitis A vaccine. Adults who wish to be protected from this disease, have certain high-risk conditions, work with hepatitis A-infected animals, work in hepatitis A research labs, or travel to or work in countries with a high rate of hepatitis A should be immunized. Adults who were previously unvaccinated and who anticipate close contact with an international adoptee during the first 60 days after arrival in the [place] from a country with a high rate of hepatitis A should be immunized.  Hepatitis B vaccine. Adults who wish to be protected from this disease, have certain high-risk  conditions, may be exposed to blood or other infectious body fluids, are household contacts or sex partners of hepatitis B positive people, are clients or workers in certain care facilities, or travel to or work in countries with a high rate of hepatitis B should be immunized.  Haemophilus influenzae type b (Hib) vaccine. A previously unvaccinated person with asplenia or sickle cell disease or having a scheduled splenectomy should receive 1 dose of Hib vaccine. Regardless of previous immunization, a recipient of a hematopoietic stem cell transplant should receive a 3-dose series 6-12 months after her successful transplant. Hib vaccine is not recommended for adults with HIV infection. Preventive Services / Frequency Ages 51 to 4 years  Blood pressure check.** / Every 3-5 years.  Lipid and cholesterol check.** / Every 5 years beginning at age 28.  Clinical breast exam.** / Every 3 years for women in their 47s and 3s.  BRCA-related cancer risk assessment.** / For women who have family members with a BRCA-related cancer (breast, ovarian, tubal, or peritoneal cancers).  Pap test.** / Every 2 years from ages 41 through 65. Every 3 years starting at age 71 through age 61 or 44 with a history of 3 consecutive normal Pap tests.  HPV screening.** / Every 3 years from ages 42 through ages 40 to 53 with a history of 3 consecutive normal Pap tests.  Hepatitis C blood test.** / For any individual with known risks for hepatitis C.  Skin self-exam. / Monthly.  Influenza vaccine. / Every year.  Tetanus, diphtheria, and acellular pertussis (Tdap, Td) vaccine.** / Consult your health care provider. Pregnant women should receive 1 dose of Tdap vaccine during each pregnancy. 1 dose of Td every 10 years.  Varicella vaccine.** / Consult your health care provider. Pregnant females who do not have evidence of immunity should receive the  first dose after pregnancy.  HPV vaccine. / 3 doses over 6 months, if 22  and younger. The vaccine is not recommended for use in pregnant females. However, pregnancy testing is not needed before receiving a dose.  Measles, mumps, rubella (MMR) vaccine.** / You need at least 1 dose of MMR if you were born in 1957 or later. You may also need a 2nd dose. For females of childbearing age, rubella immunity should be determined. If there is no evidence of immunity, females who are not pregnant should be vaccinated. If there is no evidence of immunity, females who are pregnant should delay immunization until after pregnancy.  Pneumococcal 13-valent conjugate (PCV13) vaccine.** / Consult your health care provider.  Pneumococcal polysaccharide (PPSV23) vaccine.** / 1 to 2 doses if you smoke cigarettes or if you have certain conditions.  Meningococcal vaccine.** / 1 dose if you are age 109 to 41 years and a Market researcher living in a residence hall, or have one of several medical conditions, you need to get vaccinated against meningococcal disease. You may also need additional booster doses.  Hepatitis A vaccine.** / Consult your health care provider.  Hepatitis B vaccine.** / Consult your health care provider.  Haemophilus influenzae type b (Hib) vaccine.** / Consult your health care provider. Ages 11 to 30 years  Blood pressure check.** / Every year.  Lipid and cholesterol check.** / Every 5 years beginning at age 9 years.  Lung cancer screening. / Every year if you are aged 14-80 years and have a 30-pack-year history of smoking and currently smoke or have quit within the past 15 years. Yearly screening is stopped once you have quit smoking for at least 15 years or develop a health problem that would prevent you from having lung cancer treatment.  Clinical breast exam.** / Every year after age 50 years.  BRCA-related cancer risk assessment.** / For women who have family members with a BRCA-related cancer (breast, ovarian, tubal, or peritoneal  cancers).  Mammogram.** / Every year beginning at age 79 years and continuing for as long as you are in good health. Consult with your health care provider.  Pap test.** / Every 3 years starting at age 23 years through age 82 or 81 years with a history of 3 consecutive normal Pap tests.  HPV screening.** / Every 3 years from ages 3 years through ages 69 to 48 years with a history of 3 consecutive normal Pap tests.  Fecal occult blood test (FOBT) of stool. / Every year beginning at age 5 years and continuing until age 39 years. You may not need to do this test if you get a colonoscopy every 10 years.  Flexible sigmoidoscopy or colonoscopy.** / Every 5 years for a flexible sigmoidoscopy or every 10 years for a colonoscopy beginning at age 4 years and continuing until age 98 years.  Hepatitis C blood test.** / For all people born from [birth year] through 1967 and any individual with known risks for hepatitis C.  Skin self-exam. / Monthly.  Influenza vaccine. / Every year.  Tetanus, diphtheria, and acellular pertussis (Tdap/Td) vaccine.** / Consult your health care provider. Pregnant women should receive 1 dose of Tdap vaccine during each pregnancy. 1 dose of Td every 10 years.  Varicella vaccine.** / Consult your health care provider. Pregnant females who do not have evidence of immunity should receive the first dose after pregnancy.  Zoster vaccine.** / 1 dose for adults aged 70 years or older.  Measles, mumps, rubella (MMR) vaccine.** /  You need at least 1 dose of MMR if you were born in 1957 or later. You may also need a second dose. For females of childbearing age, rubella immunity should be determined. If there is no evidence of immunity, females who are not pregnant should be vaccinated. If there is no evidence of immunity, females who are pregnant should delay immunization until after pregnancy.  Pneumococcal 13-valent conjugate (PCV13) vaccine.** / Consult your health care  provider.  Pneumococcal polysaccharide (PPSV23) vaccine.** / 1 to 2 doses if you smoke cigarettes or if you have certain conditions.  Meningococcal vaccine.** / Consult your health care provider.  Hepatitis A vaccine.** / Consult your health care provider.  Hepatitis B vaccine.** / Consult your health care provider.  Haemophilus influenzae type b (Hib) vaccine.** / Consult your health care provider. Ages 37 years and over  Blood pressure check.** / Every year.  Lipid and cholesterol check.** / Every 5 years beginning at age 49 years.  Lung cancer screening. / Every year if you are aged 59-80 years and have a 30-pack-year history of smoking and currently smoke or have quit within the past 15 years. Yearly screening is stopped once you have quit smoking for at least 15 years or develop a health problem that would prevent you from having lung cancer treatment.  Clinical breast exam.** / Every year after age 68 years.  BRCA-related cancer risk assessment.** / For women who have family members with a BRCA-related cancer (breast, ovarian, tubal, or peritoneal cancers).  Mammogram.** / Every year beginning at age 88 years and continuing for as long as you are in good health. Consult with your health care provider.  Pap test.** / Every 3 years starting at age 77 years through age 27 or 19 years with 3 consecutive normal Pap tests. Testing can be stopped between 65 and 70 years with 3 consecutive normal Pap tests and no abnormal Pap or HPV tests in the past 10 years.  HPV screening.** / Every 3 years from ages 13 years through ages 8 or 73 years with a history of 3 consecutive normal Pap tests. Testing can be stopped between 65 and 70 years with 3 consecutive normal Pap tests and no abnormal Pap or HPV tests in the past 10 years.  Fecal occult blood test (FOBT) of stool. / Every year beginning at age 48 years and continuing until age 65 years. You may not need to do this test if you get a  colonoscopy every 10 years.  Flexible sigmoidoscopy or colonoscopy.** / Every 5 years for a flexible sigmoidoscopy or every 10 years for a colonoscopy beginning at age 95 years and continuing until age 24 years.  Hepatitis C blood test.** / For all people born from [birth year] through 1967 and any individual with known risks for hepatitis C.  Osteoporosis screening.** / A one-time screening for women ages 38 years and over and women at risk for fractures or osteoporosis.  Skin self-exam. / Monthly.  Influenza vaccine. / Every year.  Tetanus, diphtheria, and acellular pertussis (Tdap/Td) vaccine.** / 1 dose of Td every 10 years.  Varicella vaccine.** / Consult your health care provider.  Zoster vaccine.** / 1 dose for adults aged 23 years or older.  Pneumococcal 13-valent conjugate (PCV13) vaccine.** / Consult your health care provider.  Pneumococcal polysaccharide (PPSV23) vaccine.** / 1 dose for all adults aged 63 years and older.  Meningococcal vaccine.** / Consult your health care provider.  Hepatitis A vaccine.** / Consult your health care provider.  Hepatitis B vaccine.** / Consult your health care provider.  Haemophilus influenzae type b (Hib) vaccine.** / Consult your health care provider. ** Family history and personal history of risk and conditions may change your health care provider's recommendations.   This information is not intended to replace advice given to you by your health care provider. Make sure you discuss any questions you have with your health care provider.   Document Released: 03/05/2001 Document Revised: 01/28/2014 Document Reviewed: 06/04/2010 Elsevier Interactive Patient Education [employer].

## 2015-06-28 NOTE — Addendum Note (Signed)
Addended by: Anabel Halon on: 06/28/2015 10:33 AM   Modules accepted: Miquel Dunn

## 2015-06-28 NOTE — Addendum Note (Signed)
Addended by: Tasia Catchings on: 06/28/2015 09:39 AM   Modules accepted: Orders

## 2015-06-28 NOTE — Telephone Encounter (Signed)
The Rx's below have not been sent to [place].

## 2015-06-28 NOTE — Addendum Note (Signed)
Addended by: Anabel Halon on: 06/28/2015 09:29 AM   Modules accepted: Miquel Dunn

## 2015-06-28 NOTE — Telephone Encounter (Signed)
Help me out. Call [facility] and cancel amoxicillin, benzonatate and flonase. Sent by accident to pt pharmacy.

## 2015-06-29 LAB — URINE CULTURE
Colony Count: NO GROWTH
ORGANISM ID, BACTERIA: NO GROWTH

## 2015-06-30 NOTE — Telephone Encounter (Signed)
Message left to call the office.    KP 

## 2015-09-05 ENCOUNTER — Other Ambulatory Visit: Payer: Self-pay | Admitting: Family Medicine

## 2015-09-05 DIAGNOSIS — E785 Hyperlipidemia, unspecified: Secondary | ICD-10-CM

## 2015-09-05 DIAGNOSIS — I10 Essential (primary) hypertension: Secondary | ICD-10-CM

## 2015-09-05 MED ORDER — METOPROLOL TARTRATE 50 MG PO TABS: ORAL_TABLET | ORAL | 1 refills | Status: DC

## 2015-09-05 MED ORDER — SIMVASTATIN 20 MG PO TABS: 20.0000 mg | ORAL_TABLET | Freq: Every day | ORAL | 1 refills | Status: DC

## 2015-09-20 DIAGNOSIS — H52203 Unspecified astigmatism, bilateral: Secondary | ICD-10-CM | POA: Diagnosis not present

## 2015-09-20 DIAGNOSIS — H04123 Dry eye syndrome of bilateral lacrimal glands: Secondary | ICD-10-CM | POA: Diagnosis not present

## 2015-09-20 DIAGNOSIS — H2513 Age-related nuclear cataract, bilateral: Secondary | ICD-10-CM | POA: Diagnosis not present

## 2015-09-20 DIAGNOSIS — H01001 Unspecified blepharitis right upper eyelid: Secondary | ICD-10-CM | POA: Diagnosis not present

## 2015-09-28 DIAGNOSIS — C44529 Squamous cell carcinoma of skin of other part of trunk: Secondary | ICD-10-CM | POA: Diagnosis not present

## 2015-09-28 DIAGNOSIS — D1801 Hemangioma of skin and subcutaneous tissue: Secondary | ICD-10-CM | POA: Diagnosis not present

## 2015-09-28 DIAGNOSIS — L814 Other melanin hyperpigmentation: Secondary | ICD-10-CM | POA: Diagnosis not present

## 2015-09-28 DIAGNOSIS — L570 Actinic keratosis: Secondary | ICD-10-CM | POA: Diagnosis not present

## 2015-09-28 DIAGNOSIS — D225 Melanocytic nevi of trunk: Secondary | ICD-10-CM | POA: Diagnosis not present

## 2015-09-28 DIAGNOSIS — L821 Other seborrheic keratosis: Secondary | ICD-10-CM | POA: Diagnosis not present

## 2015-10-04 DIAGNOSIS — J209 Acute bronchitis, unspecified: Secondary | ICD-10-CM | POA: Diagnosis not present

## 2015-10-04 DIAGNOSIS — R0981 Nasal congestion: Secondary | ICD-10-CM | POA: Diagnosis not present

## 2015-10-19 DIAGNOSIS — C44529 Squamous cell carcinoma of skin of other part of trunk: Secondary | ICD-10-CM | POA: Diagnosis not present

## 2015-11-13 DIAGNOSIS — Z23 Encounter for immunization: Secondary | ICD-10-CM | POA: Diagnosis not present

## 2015-11-30 DIAGNOSIS — Z85828 Personal history of other malignant neoplasm of skin: Secondary | ICD-10-CM | POA: Diagnosis not present

## 2015-11-30 DIAGNOSIS — L570 Actinic keratosis: Secondary | ICD-10-CM | POA: Diagnosis not present

## 2016-01-26 ENCOUNTER — Other Ambulatory Visit: Payer: Self-pay | Admitting: Family Medicine

## 2016-01-26 DIAGNOSIS — Z1231 Encounter for screening mammogram for malignant neoplasm of breast: Secondary | ICD-10-CM

## 2016-02-09 DIAGNOSIS — M25562 Pain in left knee: Secondary | ICD-10-CM | POA: Diagnosis not present

## 2016-03-07 DIAGNOSIS — Z85828 Personal history of other malignant neoplasm of skin: Secondary | ICD-10-CM | POA: Diagnosis not present

## 2016-03-07 DIAGNOSIS — L814 Other melanin hyperpigmentation: Secondary | ICD-10-CM | POA: Diagnosis not present

## 2016-03-07 DIAGNOSIS — D225 Melanocytic nevi of trunk: Secondary | ICD-10-CM | POA: Diagnosis not present

## 2016-03-07 DIAGNOSIS — L570 Actinic keratosis: Secondary | ICD-10-CM | POA: Diagnosis not present

## 2016-03-12 ENCOUNTER — Ambulatory Visit
Admission: RE | Admit: 2016-03-12 | Discharge: 2016-03-12 | Disposition: A | Discharge status: Home / Self Care | Payer: Medicare Other | Source: Ambulatory Visit | Attending: Family Medicine | Admitting: Family Medicine

## 2016-03-12 DIAGNOSIS — Z1231 Encounter for screening mammogram for malignant neoplasm of breast: Secondary | ICD-10-CM | POA: Diagnosis not present

## 2016-03-20 ENCOUNTER — Other Ambulatory Visit: Payer: Self-pay | Admitting: Family Medicine

## 2016-03-20 DIAGNOSIS — I10 Essential (primary) hypertension: Secondary | ICD-10-CM

## 2016-03-20 MED ORDER — METOPROLOL TARTRATE 50 MG PO TABS: ORAL_TABLET | ORAL | 0 refills | Status: DC

## 2016-03-20 NOTE — Telephone Encounter (Signed)
Patient is requesting a refill of metoprolol (LOPRESSOR) 50 MG tablet  Patient states that she will only be in town for a few weeks and needs to have this completed soon. Please advise  Pharmacy: [facility] # [address] [place]

## 2016-03-20 NOTE — Telephone Encounter (Signed)
90 day supply sent. Notified pt and advised her that she is due for 1 year visit in June. She states she will call back to schedule appt as it is not a convenient time for right now.

## 2016-04-10 DIAGNOSIS — I10 Essential (primary) hypertension: Secondary | ICD-10-CM | POA: Diagnosis not present

## 2016-04-10 DIAGNOSIS — J1089 Influenza due to other identified influenza virus with other manifestations: Secondary | ICD-10-CM | POA: Diagnosis not present

## 2016-04-13 DIAGNOSIS — J1089 Influenza due to other identified influenza virus with other manifestations: Secondary | ICD-10-CM | POA: Diagnosis not present

## 2016-04-13 DIAGNOSIS — R110 Nausea: Secondary | ICD-10-CM | POA: Diagnosis not present

## 2016-04-13 DIAGNOSIS — R000 Tachycardia, unspecified: Secondary | ICD-10-CM | POA: Diagnosis not present

## 2016-04-13 DIAGNOSIS — I10 Essential (primary) hypertension: Secondary | ICD-10-CM | POA: Diagnosis not present

## 2016-05-13 ENCOUNTER — Telehealth: Payer: Self-pay | Admitting: Family Medicine

## 2016-05-13 DIAGNOSIS — E785 Hyperlipidemia, unspecified: Secondary | ICD-10-CM

## 2016-05-13 MED ORDER — SIMVASTATIN 20 MG PO TABS
20.0000 mg | ORAL_TABLET | Freq: Every day | ORAL | 0 refills | Status: DC
Start: 2016-05-13 — End: 2016-07-23

## 2016-05-13 NOTE — Telephone Encounter (Signed)
Self.  Refill request for simvastatin   Pharmacy: [facility] # [place], [place]

## 2016-05-13 NOTE — Telephone Encounter (Signed)
Patient informed refill sent to [facility]

## 2016-05-20 DIAGNOSIS — M25562 Pain in left knee: Secondary | ICD-10-CM | POA: Diagnosis not present

## 2016-05-28 DIAGNOSIS — M25562 Pain in left knee: Secondary | ICD-10-CM | POA: Diagnosis not present

## 2016-05-29 DIAGNOSIS — M1712 Unilateral primary osteoarthritis, left knee: Secondary | ICD-10-CM

## 2016-05-29 HISTORY — DX: Unilateral primary osteoarthritis, left knee: M17.12

## 2016-06-12 DIAGNOSIS — M25562 Pain in left knee: Secondary | ICD-10-CM | POA: Diagnosis not present

## 2016-07-05 NOTE — Progress Notes (Deleted)
Subjective:   Tiffany Graham is a 66 y.o. female who presents for Medicare Annual (Subsequent) preventive examination.  Review of Systems:  No ROS.  Medicare Wellness Visit. Additional risk factors are reflected in the social history.    Sleep patterns:    Home Safety/Smoke Alarms: Feels safe in home. Smoke alarms in place.  Living environment; residence and Firearm Safety:  [place] Safety/Bike Helmet: Wears seat belt.   Counseling:   Eye Exam-  Dental-  Female:   Pap-  hysterectomy     Mammo-  Last 03/12/16: BI-RADS CATEGORY  1: Negative.   Dexa scan-   Last 03/16/15: normal     CCS- last 03/16/12: diverticulosis. Recall 5 yrs    Objective:     Vitals: There were no vitals taken for this visit.  There is no height or weight on file to calculate BMI.   Tobacco History  Smoking Status  . Former Smoker  . Quit date: 01/21/1981  Smokeless Tobacco  . Never Used     Counseling given: Not Answered   Past Medical History:  Diagnosis Date  . Hyperlipidemia   . Hypertension    Past Surgical History:  Procedure Laterality Date  . APPENDECTOMY  1991  . CHOLECYSTECTOMY  2004  . TOTAL ABDOMINAL HYSTERECTOMY  1991   with BSO, secondary fibroids  . TUBAL LIGATION  1987   first one 1974, then reversed 1983   Family History  Problem Relation Age of Onset  . Colon cancer Father   . Colon cancer Unknown   . Coronary artery disease Unknown   . Asthma Unknown   . Hyperlipidemia Unknown   . Hypertension Unknown   . Osteoporosis Unknown   . Thyroid disease Unknown   . Clotting disorder Unknown        thromboembolism  . Breast cancer Neg Hx    History  Sexual Activity  . Sexual activity: Not on file    Outpatient Encounter Prescriptions as of 07/08/2016  Medication Sig  . aspirin 81 MG tablet Take 81 mg by mouth daily.  . calcium carbonate (OS-CAL) 600 MG TABS tablet Take 600 mg by mouth 2 (two) times daily with a meal.  . Cholecalciferol (VITAMIN D3) 1000 UNITS  CAPS Take by mouth.  . fluticasone (FLONASE) 50 MCG/ACT nasal spray Place 2 sprays into both nostrils daily.  Marland Kitchen glucosamine-chondroitin 500-400 MG tablet Take 1 tablet by mouth 3 (three) times daily.  . metoprolol (LOPRESSOR) 50 MG tablet TAKE 1 TABLET BY MOUTH EVERY MORNING AND 2 TABLETS EVERY EVENING  . Omega-3 Fatty Acids (FISH OIL) 1000 MG CAPS Take 1 capsule by mouth daily.  . simvastatin (ZOCOR) 20 MG tablet Take 1 tablet (20 mg total) by mouth at bedtime.   No facility-administered encounter medications on file as of 07/08/2016.     Activities of Daily Living No flowsheet data found.  Patient Care Team: Carollee Herter, Alferd Apa, DO as PCP - General Marygrace Drought, MD as Consulting Physician (Ophthalmology)    Assessment:    Physical assessment deferred to PCP.  Exercise Activities and Dietary recommendations   Diet (meal preparation, eat out, water intake, caffeinated beverages, dairy products, fruits and vegetables): {Desc; diets:16563} Breakfast: Lunch:  Dinner:      Goals    None     Fall Risk Fall Risk  06/28/2015 06/28/2015 12/14/2013  Falls in the past year? No No No   Depression Screen PHQ 2/9 Scores 06/28/2015 06/28/2015 12/14/2013  PHQ - 2  Score 0 0 0     Cognitive Function        Immunization History  Administered Date(s) Administered  . Hepatitis B 02/28/1999, 03/28/1999, 08/28/1999  . Influenza Whole 11/02/2008, 10/22/2011  . Influenza,inj,Quad PF,36+ Mos 10/05/2013  . Influenza-Unspecified 10/06/2012, 10/31/2014, 11/13/2015  . Pneumococcal Conjugate-13 04/03/2014  . Pneumococcal Polysaccharide-23 02/10/2013  . Td 01/21/2006  . Tdap 01/03/2006  . Zoster 03/16/2009   Screening Tests Health Maintenance  Topic Date Due  . Hepatitis C Screening  1950/04/03  . TETANUS/TDAP  01/22/2016  . INFLUENZA VACCINE  08/21/2016  . COLONOSCOPY  03/16/2017  . MAMMOGRAM  03/12/2018  . DEXA SCAN  Completed  . PNA vac Low Risk Adult  Addressed      Plan:    ***   I have personally reviewed and noted the following in the patient's chart:   . Medical and social history . Use of alcohol, tobacco or illicit drugs  . Current medications and supplements . Functional ability and status . Nutritional status . Physical activity . Advanced directives . List of other physicians . Hospitalizations, surgeries, and ER visits in previous 12 months . Vitals . Screenings to include cognitive, depression, and falls . Referrals and appointments  In addition, I have reviewed and discussed with patient certain preventive protocols, quality metrics, and best practice recommendations. A written personalized care plan for preventive services as well as general preventive health recommendations were provided to patient.     Shela Nevin, [place]  07/05/2016

## 2016-07-08 ENCOUNTER — Ambulatory Visit (INDEPENDENT_AMBULATORY_CARE_PROVIDER_SITE_OTHER): Payer: Medicare Other | Admitting: *Deleted

## 2016-07-08 ENCOUNTER — Ambulatory Visit: Payer: Medicare Other | Admitting: Family Medicine

## 2016-07-08 ENCOUNTER — Encounter: Payer: Self-pay | Admitting: *Deleted

## 2016-07-08 VITALS — BP 160/72 | HR 55 | Ht 65.0 in | Wt 149.2 lb

## 2016-07-08 DIAGNOSIS — Z0000 Encounter for general adult medical examination without abnormal findings: Secondary | ICD-10-CM

## 2016-07-08 NOTE — Progress Notes (Signed)
Subjective:   BAYAN HEDSTROM is a 66 y.o. female who presents for Medicare Annual (Subsequent) preventive examination.  Review of Systems:  No ROS.  Medicare Wellness Visit. Additional risk factors are reflected in the social history.    Sleep patterns: Restless sleep.  Wakes 1-2x to urinate. Home Safety/Smoke Alarms: Feels safe in home. Smoke alarms in place.  Living environment; residence and Adult nurse: lives with husband. Does fine with stairs. Guns safely stored Seat Belt Safety/Bike Helmet: Wears seat belt.   Counseling:   Eye Exam- Dr.Tanner yearly. Wears glasses.   Female:   Pap- Hysterectomy.    Mammo- Last  03/12/16: BI-RADS CATEGORY  1: Negative.  Dexa scan- Last 03/16/15: normal        CCS- Last 03/16/12: moderate diverticulosis.     Objective:     Vitals: BP (!) 160/72 (BP Location: Right Arm, Patient Position: Sitting, Cuff Size: Normal)   Pulse (!) 55   Ht 5' 5"  (1.651 m)   Wt 149 lb 3.2 oz (67.7 kg)   SpO2 98%   BMI 24.83 kg/m   Body mass index is 24.83 kg/m.   Tobacco History  Smoking Status  . Former Smoker  . Quit date: 01/21/1981  Smokeless Tobacco  . Never Used     Counseling given: No   Past Medical History:  Diagnosis Date  . Hyperlipidemia   . Hypertension   . Osteoarthritis of left patellofemoral joint 05/29/2016   Past Surgical History:  Procedure Laterality Date  . APPENDECTOMY  1991  . CHOLECYSTECTOMY  2004  . TOTAL ABDOMINAL HYSTERECTOMY  1991   with BSO, secondary fibroids  . TUBAL LIGATION  1987   first one 1974, then reversed 1983   Family History  Problem Relation Age of Onset  . Colon cancer Father   . Colon cancer Unknown   . Coronary artery disease Unknown   . Asthma Unknown   . Hyperlipidemia Unknown   . Hypertension Unknown   . Osteoporosis Unknown   . Thyroid disease Unknown   . Clotting disorder Unknown        thromboembolism  . Breast cancer Neg Hx    History  Sexual Activity  . Sexual activity:  Yes    Outpatient Encounter Prescriptions as of 07/08/2016  Medication Sig  . aspirin 81 MG tablet Take 81 mg by mouth daily.  . calcium carbonate (OS-CAL) 600 MG TABS tablet Take 600 mg by mouth 2 (two) times daily with a meal.  . Cholecalciferol (VITAMIN D3) 1000 UNITS CAPS Take by mouth.  . fluticasone (FLONASE) 50 MCG/ACT nasal spray Place 2 sprays into both nostrils daily.  Marland Kitchen glucosamine-chondroitin 500-400 MG tablet Take 1 tablet by mouth 3 (three) times daily.  . metoprolol (LOPRESSOR) 50 MG tablet TAKE 1 TABLET BY MOUTH EVERY MORNING AND 2 TABLETS EVERY EVENING  . Omega-3 Fatty Acids (FISH OIL) 1000 MG CAPS Take 1 capsule by mouth daily.  . simvastatin (ZOCOR) 20 MG tablet Take 1 tablet (20 mg total) by mouth at bedtime.   No facility-administered encounter medications on file as of 07/08/2016.     Activities of Daily Living In your present state of health, do you have any difficulty performing the following activities: 07/08/2016  Hearing? N  Vision? N  Difficulty concentrating or making decisions? N  Walking or climbing stairs? N  Dressing or bathing? N  Doing errands, shopping? N  Preparing Food and eating ? N  Using the Toilet? N  In the  past six months, have you accidently leaked urine? N  Do you have problems with loss of bowel control? N  Managing your Medications? N  Managing your Finances? N  Housekeeping or managing your Housekeeping? N  Some recent data might be hidden    Patient Care Team: Carollee Herter, Alferd Apa, DO as PCP - General Marygrace Drought, MD as Consulting Physician (Ophthalmology)    Assessment:    Physical assessment deferred to PCP.  Exercise Activities and Dietary recommendations Current Exercise Habits: Home exercise routine, Type of exercise: stretching;walking;strength training/weights, Time (Minutes): 40, Frequency (Times/Week): 3, Weekly Exercise (Minutes/Week): 120, Exercise limited by: orthopedic condition(s)  Goals    . Move to  [place]  07/08/2016 06/28/2015 06/28/2015 12/14/2013  Falls in the past year? No No No No   Depression Screen PHQ 2/9 Scores 07/08/2016 06/28/2015 06/28/2015 12/14/2013  PHQ - 2 Score 0 0 0 0     Cognitive Function MMSE - Mini Mental State Exam 07/08/2016  Orientation to time 5  Orientation to Place 5  Registration 3  Attention/ Calculation 5  Recall 3  Language- name 2 objects 2  Language- repeat 1  Language- follow 3 step command 3  Language- read & follow direction 1  Write a sentence 1  Copy design 1  Total score 30        Immunization History  Administered Date(s) Administered  . Hepatitis B 02/28/1999, 03/28/1999, 08/28/1999  . Influenza Whole 11/02/2008, 10/22/2011  . Influenza,inj,Quad PF,36+ Mos 10/05/2013  . Influenza-Unspecified 10/06/2012, 10/31/2014, 11/13/2015  . Pneumococcal Conjugate-13 04/03/2014  . Pneumococcal Polysaccharide-23 02/10/2013  . Td 01/21/2006  . Tdap 01/03/2006  . Zoster 03/16/2009   Screening Tests Health Maintenance  Topic Date Due  . Hepatitis C Screening  06/06/1950  . TETANUS/TDAP  01/22/2016  . INFLUENZA VACCINE  08/21/2016  . COLONOSCOPY  03/16/2017  . MAMMOGRAM  03/12/2018  . DEXA SCAN  Completed  . PNA vac Low Risk Adult  Addressed      Plan:   Follow up with Dr.Lowne tomorrow @1130 . You will have labs done after you meet with her.  Continue to eat heart healthy diet (full of fruits, vegetables, whole grains, lean protein, water--limit salt, fat, and sugar intake) and increase physical activity as tolerated.  Continue doing brain stimulating activities (puzzles, reading, adult coloring books, staying active) to keep memory sharp.   Per Dr.Lowne,  increase your Metoprolol 50 mg to 2 tablets in the morning and 2 tablets in the evening.  I have personally reviewed and noted the following in the patient's chart:   . Medical and social history . Use of alcohol, tobacco or illicit drugs  . Current  medications and supplements . Functional ability and status . Nutritional status . Physical activity . Advanced directives . List of other physicians . Hospitalizations, surgeries, and ER visits in previous 12 months . Vitals . Screenings to include cognitive, depression, and falls . Referrals and appointments  In addition, I have reviewed and discussed with patient certain preventive protocols, quality metrics, and best practice recommendations. A written personalized care plan for preventive services as well as general preventive health recommendations were provided to patient.     Naaman Plummer [place], [place]  07/08/2016

## 2016-07-08 NOTE — Patient Instructions (Signed)
Tiffany Graham , Thank you for taking time to come for your Medicare Wellness Visit. I appreciate your ongoing commitment to your health goals. Please review the following plan we discussed and let me know if I can assist you in the future.   These are the goals we discussed: Goals    . Move to [place]       This is a list of the screening recommended for you and due dates:  Health Maintenance  Topic Date Due  .  Hepatitis C: One time screening is recommended by Center for Disease Control  (CDC) for  adults born from [birth year] through 1967.   [birth year]  . Tetanus Vaccine  01/22/2016  . Flu Shot  08/21/2016  . Colon Cancer Screening  03/16/2017  . Mammogram  03/12/2018  . DEXA scan (bone density measurement)  Completed  . Pneumonia vaccines  Addressed  Follow up with Dr.Lowne tomorrow @1130 . You will have labs done after you meet with her.  Continue to eat heart healthy diet (full of fruits, vegetables, whole grains, lean protein, water--limit salt, fat, and sugar intake) and increase physical activity as tolerated.  Continue doing brain stimulating activities (puzzles, reading, adult coloring books, staying active) to keep memory sharp.   Per Dr.Lowne,  increase your Metoprolol 50 mg to 2 tablets in the morning and 2 tablets in the evening.   Health Maintenance for Postmenopausal Women Menopause is a normal process in which your reproductive ability comes to an end. This process happens gradually over a span of months to years, usually between the ages of 6 and 11. Menopause is complete when you have missed 12 consecutive menstrual periods. It is important to talk with your health care provider about some of the most common conditions that affect postmenopausal women, such as heart disease, cancer, and bone loss (osteoporosis). Adopting a healthy lifestyle and getting preventive care can help to promote your health and wellness. Those actions can also lower your chances of developing some of  these common conditions. What should I know about menopause? During menopause, you may experience a number of symptoms, such as:  Moderate-to-severe hot flashes.  Night sweats.  Decrease in sex drive.  Mood swings.  Headaches.  Tiredness.  Irritability.  Memory problems.  Insomnia.  Choosing to treat or not to treat menopausal changes is an individual decision that you make with your health care provider. What should I know about hormone replacement therapy and supplements? Hormone therapy products are effective for treating symptoms that are associated with menopause, such as hot flashes and night sweats. Hormone replacement carries certain risks, especially as you become older. If you are thinking about using estrogen or estrogen with progestin treatments, discuss the benefits and risks with your health care provider. What should I know about heart disease and stroke? Heart disease, heart attack, and stroke become more likely as you age. This may be due, in part, to the hormonal changes that your body experiences during menopause. These can affect how your body processes dietary fats, triglycerides, and cholesterol. Heart attack and stroke are both medical emergencies. There are many things that you can do to help prevent heart disease and stroke:  Have your blood pressure checked at least every 1-2 years. High blood pressure causes heart disease and increases the risk of stroke.  If you are 39-32 years old, ask your health care provider if you should take aspirin to prevent a heart attack or a stroke.  Do not use any tobacco  products, including cigarettes, chewing tobacco, or electronic cigarettes. If you need help quitting, ask your health care provider.  It is important to eat a healthy diet and maintain a healthy weight. ? Be sure to include plenty of vegetables, fruits, low-fat dairy products, and lean protein. ? Avoid eating foods that are high in solid fats, added  sugars, or salt (sodium).  Get regular exercise. This is one of the most important things that you can do for your health. ? Try to exercise for at least 150 minutes each week. The type of exercise that you do should increase your heart rate and make you sweat. This is known as moderate-intensity exercise. ? Try to do strengthening exercises at least twice each week. Do these in addition to the moderate-intensity exercise.  Know your numbers.Ask your health care provider to check your cholesterol and your blood glucose. Continue to have your blood tested as directed by your health care provider.  What should I know about cancer screening? There are several types of cancer. Take the following steps to reduce your risk and to catch any cancer development as early as possible. Breast Cancer  Practice breast self-awareness. ? This means understanding how your breasts normally appear and feel. ? It also means doing regular breast self-exams. Let your health care provider know about any changes, no matter how small.  If you are 61 or older, have a clinician do a breast exam (clinical breast exam or CBE) every year. Depending on your age, family history, and medical history, it may be recommended that you also have a yearly breast X-ray (mammogram).  If you have a family history of breast cancer, talk with your health care provider about genetic screening.  If you are at high risk for breast cancer, talk with your health care provider about having an MRI and a mammogram every year.  Breast cancer (BRCA) gene test is recommended for women who have family members with BRCA-related cancers. Results of the assessment will determine the need for genetic counseling and BRCA1 and for BRCA2 testing. BRCA-related cancers include these types: ? Breast. This occurs in males or females. ? Ovarian. ? Tubal. This may also be called fallopian tube cancer. ? Cancer of the abdominal or pelvic lining (peritoneal  cancer). ? Prostate. ? Pancreatic.  Cervical, Uterine, and Ovarian Cancer Your health care provider may recommend that you be screened regularly for cancer of the pelvic organs. These include your ovaries, uterus, and vagina. This screening involves a pelvic exam, which includes checking for microscopic changes to the surface of your cervix (Pap test).  For women ages 21-65, health care providers may recommend a pelvic exam and a Pap test every three years. For women ages 26-65, they may recommend the Pap test and pelvic exam, combined with testing for human papilloma virus (HPV), every five years. Some types of HPV increase your risk of cervical cancer. Testing for HPV may also be done on women of any age who have unclear Pap test results.  Other health care providers may not recommend any screening for nonpregnant women who are considered low risk for pelvic cancer and have no symptoms. Ask your health care provider if a screening pelvic exam is right for you.  If you have had past treatment for cervical cancer or a condition that could lead to cancer, you need Pap tests and screening for cancer for at least 20 years after your treatment. If Pap tests have been discontinued for you, your risk factors (  such as having a new sexual partner) need to be reassessed to determine if you should start having screenings again. Some women have medical problems that increase the chance of getting cervical cancer. In these cases, your health care provider may recommend that you have screening and Pap tests more often.  If you have a family history of uterine cancer or ovarian cancer, talk with your health care provider about genetic screening.  If you have vaginal bleeding after reaching menopause, tell your health care provider.  There are currently no reliable tests available to screen for ovarian cancer.  Lung Cancer Lung cancer screening is recommended for adults 85-79 years old who are at high risk for  lung cancer because of a history of smoking. A yearly low-dose CT scan of the lungs is recommended if you:  Currently smoke.  Have a history of at least 30 pack-years of smoking and you currently smoke or have quit within the past 15 years. A pack-year is smoking an average of one pack of cigarettes per day for one year.  Yearly screening should:  Continue until it has been 15 years since you quit.  Stop if you develop a health problem that would prevent you from having lung cancer treatment.  Colorectal Cancer  This type of cancer can be detected and can often be prevented.  Routine colorectal cancer screening usually begins at age 44 and continues through age 49.  If you have risk factors for colon cancer, your health care provider may recommend that you be screened at an earlier age.  If you have a family history of colorectal cancer, talk with your health care provider about genetic screening.  Your health care provider may also recommend using home test kits to check for hidden blood in your stool.  A small camera at the end of a tube can be used to examine your colon directly (sigmoidoscopy or colonoscopy). This is done to check for the earliest forms of colorectal cancer.  Direct examination of the colon should be repeated every 5-10 years until age 58. However, if early forms of precancerous polyps or small growths are found or if you have a family history or genetic risk for colorectal cancer, you may need to be screened more often.  Skin Cancer  Check your skin from head to toe regularly.  Monitor any moles. Be sure to tell your health care provider: ? About any new moles or changes in moles, especially if there is a change in a mole's shape or color. ? If you have a mole that is larger than the size of a pencil eraser.  If any of your family members has a history of skin cancer, especially at a young age, talk with your health care provider about genetic  screening.  Always use sunscreen. Apply sunscreen liberally and repeatedly throughout the day.  Whenever you are outside, protect yourself by wearing long sleeves, pants, a wide-brimmed hat, and sunglasses.  What should I know about osteoporosis? Osteoporosis is a condition in which bone destruction happens more quickly than new bone creation. After menopause, you may be at an increased risk for osteoporosis. To help prevent osteoporosis or the bone fractures that can happen because of osteoporosis, the following is recommended:  If you are 55-85 years old, get at least 1,000 mg of calcium and at least 600 mg of vitamin D per day.  If you are older than age 69 but younger than age 65, get at least 1,200 mg of  calcium and at least 600 mg of vitamin D per day.  If you are older than age 93, get at least 1,200 mg of calcium and at least 800 mg of vitamin D per day.  Smoking and excessive alcohol intake increase the risk of osteoporosis. Eat foods that are rich in calcium and vitamin D, and do weight-bearing exercises several times each week as directed by your health care provider. What should I know about how menopause affects my mental health? Depression may occur at any age, but it is more common as you become older. Common symptoms of depression include:  Low or sad mood.  Changes in sleep patterns.  Changes in appetite or eating patterns.  Feeling an overall lack of motivation or enjoyment of activities that you previously enjoyed.  Frequent crying spells.  Talk with your health care provider if you think that you are experiencing depression. What should I know about immunizations? It is important that you get and maintain your immunizations. These include:  Tetanus, diphtheria, and pertussis (Tdap) booster vaccine.  Influenza every year before the flu season begins.  Pneumonia vaccine.  Shingles vaccine.  Your health care provider may also recommend other  immunizations. This information is not intended to replace advice given to you by your health care provider. Make sure you discuss any questions you have with your health care provider. Document Released: 03/01/2005 Document Revised: 07/28/2015 Document Reviewed: 10/11/2014 Elsevier Interactive Patient Education  2018 [employer].

## 2016-07-09 ENCOUNTER — Ambulatory Visit (INDEPENDENT_AMBULATORY_CARE_PROVIDER_SITE_OTHER): Payer: Medicare Other | Admitting: Family Medicine

## 2016-07-09 ENCOUNTER — Encounter: Payer: Self-pay | Admitting: Family Medicine

## 2016-07-09 VITALS — BP 120/66 | HR 59 | Temp 98.1°F | Resp 18 | Ht 64.0 in | Wt 148.2 lb

## 2016-07-09 DIAGNOSIS — E785 Hyperlipidemia, unspecified: Secondary | ICD-10-CM | POA: Diagnosis not present

## 2016-07-09 DIAGNOSIS — Z1159 Encounter for screening for other viral diseases: Secondary | ICD-10-CM | POA: Diagnosis not present

## 2016-07-09 DIAGNOSIS — R0789 Other chest pain: Secondary | ICD-10-CM | POA: Diagnosis not present

## 2016-07-09 DIAGNOSIS — I10 Essential (primary) hypertension: Secondary | ICD-10-CM | POA: Diagnosis not present

## 2016-07-09 NOTE — Assessment & Plan Note (Signed)
Tolerating statin, encouraged heart healthy diet, avoid trans fats, minimize simple carbs and saturated fats. Increase exercise as tolerated 

## 2016-07-09 NOTE — Assessment & Plan Note (Signed)
Pt did not want to have ekg today She wanted to come back to have it done She will return Monday for labs and ekg  If cp returns -- go to ER

## 2016-07-09 NOTE — Progress Notes (Addendum)
Subjective:  I acted as a Education administrator for Dr. Curt Jews, RMA  Patient ID: Tiffany Graham, female    DOB: 1950/08/17, 66 y.o.   MRN: [id number]  Chief Complaint  Patient presents with  . Hypertension  . Hyperlipidemia  . Medicare Wellness    HPI  Patient is in today for a follow visit. Patient wants to discuss medications. No acute concerns.  She has had some chest pressure in the past.  No sob, no palpitations. + heartburn  Patient Care Team: Carollee Herter, Alferd Apa, DO as PCP - General Marygrace Drought, MD as Consulting Physician (Ophthalmology)   Past Medical History:  Diagnosis Date  . Hyperlipidemia   . Hypertension   . 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 APPENDECTOMY  1991  . CHOLECYSTECTOMY  2004  . TOTAL ABDOMINAL HYSTERECTOMY  1991   with BSO, secondary fibroids  . TUBAL LIGATION  1987   first one 1974, then reversed 1983    Family History  Problem Relation Age of Onset  . Colon cancer Father   . CAD Father   . Atrial fibrillation Father   . Colon cancer Unknown   . Coronary artery disease Unknown   . Asthma Unknown   . Hyperlipidemia Unknown   . Hypertension Unknown   . Osteoporosis Unknown   . Thyroid disease Unknown   . Clotting disorder Unknown        thromboembolism  . Heart attack Mother 51  . Atrial fibrillation Brother   . Skin cancer Brother   . Atrial fibrillation Other   . Breast cancer Neg Hx     Social History   Social History  . Marital status: Married    Spouse name: N/A  . Number of children: N/A  . Years of education: N/A   Occupational History  . Not on file.   Social History Main Topics  . Smoking status: Former Smoker    Quit date: 01/21/1981  . Smokeless tobacco: Never Used  . Alcohol use 2.4 oz/week    4 Glasses of wine per week     Comment: 1 bottle of wine per weekend  . Drug use: No  . Sexual activity: Yes   Other Topics Concern  . Not  on file   Social History Narrative  . No narrative on file    Outpatient Medications Prior to Visit  Medication Sig Dispense Refill  . aspirin 81 MG tablet Take 81 mg by mouth daily.    . calcium carbonate (OS-CAL) 600 MG TABS tablet Take 600 mg by mouth 2 (two) times daily with a meal.    . Cholecalciferol (VITAMIN D3) 1000 UNITS CAPS Take by mouth.    . fluticasone (FLONASE) 50 MCG/ACT nasal spray Place 2 sprays into both nostrils daily. 16 g 1  . glucosamine-chondroitin 500-400 MG tablet Take 1 tablet by mouth 3 (three) times daily.    . Omega-3 Fatty Acids (FISH OIL) 1000 MG CAPS Take 1 capsule by mouth daily.    . simvastatin (ZOCOR) 20 MG tablet Take 1 tablet (20 mg total) by mouth at bedtime. 90 tablet 0  . metoprolol (LOPRESSOR) 50 MG tablet TAKE 1 TABLET BY MOUTH EVERY MORNING AND 2 TABLETS EVERY EVENING (Patient taking differently: TAKE 2 TABLETS BY MOUTH EVERY MORNING AND 2 TABLETS EVERY EVENING) 270 tablet 0   No facility-administered medications prior to visit.     Allergies  Allergen Reactions  .  Triprolidine-Pseudoephedrine Hives    Ingredient in Actifed    Review of Systems  Constitutional: Negative for chills, fever and malaise/fatigue.  HENT: Negative for congestion and hearing loss.   Eyes: Negative for discharge.  Respiratory: Negative for cough, sputum production and shortness of breath.   Cardiovascular: Positive for chest pain. Negative for palpitations and leg swelling.  Gastrointestinal: Positive for heartburn. Negative for abdominal pain, blood in stool, constipation, diarrhea, nausea and vomiting.  Genitourinary: Negative for dysuria, frequency, hematuria and urgency.  Musculoskeletal: Negative for back pain, falls and myalgias.  Skin: Negative for rash.  Neurological: Negative for dizziness, sensory change, loss of consciousness, weakness and headaches.  Endo/Heme/Allergies: Negative for environmental allergies. Does not bruise/bleed easily.    Psychiatric/Behavioral: Negative for depression and suicidal ideas. The patient is not nervous/anxious and does not have insomnia.        Objective:    Physical Exam  Constitutional: She is oriented to person, place, and time. She appears well-developed and well-nourished.  HENT:  Head: Normocephalic and atraumatic.  Eyes: Conjunctivae and EOM are normal.  Neck: Normal range of motion. Neck supple. No JVD present. Carotid bruit is not present. No thyromegaly present.  Cardiovascular: Normal rate, regular rhythm and normal heart sounds.   No murmur heard. Pulmonary/Chest: Effort normal and breath sounds normal. No respiratory distress. She has no wheezes. She has no rales. She exhibits no tenderness.  Musculoskeletal: She exhibits no edema.  Neurological: She is alert and oriented to person, place, and time.  Psychiatric: She has a normal mood and affect.  Nursing note and vitals reviewed.   BP 120/66 (BP Location: Left Arm, Patient Position: Sitting, Cuff Size: Normal)   Pulse (!) 59   Temp 98.1 F (36.7 C) (Oral)   Resp 18   Ht 5' 4"  (1.626 m)   Wt 148 lb 3.2 oz (67.2 kg)   SpO2 98%   BMI 25.44 kg/m  Wt Readings from Last 3 Encounters:  07/09/16 148 lb 3.2 oz (67.2 kg)  07/08/16 149 lb 3.2 oz (67.7 kg)  06/28/15 141 lb 6.4 oz (64.1 kg)   BP Readings from Last 3 Encounters:  07/09/16 120/66  07/08/16 (!) 160/72  06/28/15 120/84     Immunization History  Administered Date(s) Administered  . Hepatitis B 02/28/1999, 03/28/1999, 08/28/1999  . Influenza Whole 11/02/2008, 10/22/2011  . Influenza,inj,Quad PF,36+ Mos 10/05/2013  . Influenza-Unspecified 10/06/2012, 10/31/2014, 11/13/2015  . Pneumococcal Conjugate-13 04/03/2014  . Pneumococcal Polysaccharide-23 02/10/2013  . Td 01/21/2006  . Tdap 01/03/2006  . Zoster 03/16/2009    Health Maintenance  Topic Date Due  . Hepatitis C Screening  [birth year]  . TETANUS/TDAP  01/22/2016  . INFLUENZA VACCINE  08/21/2016  .  COLONOSCOPY  03/16/2017  . MAMMOGRAM  03/12/2018  . DEXA SCAN  Completed  . PNA vac Low Risk Adult  Addressed    Lab Results  Component Value Date   WBC 6.2 06/28/2015   HGB 14.1 06/28/2015   HCT 41.2 06/28/2015   PLT 206.0 06/28/2015   GLUCOSE 89 06/28/2015   CHOL 154 06/28/2015   TRIG 146.0 06/28/2015   HDL 56.20 06/28/2015   LDLDIRECT 71.2 12/21/2013   LDLCALC 69 06/28/2015   ALT 12 06/28/2015   AST 19 06/28/2015   NA 142 06/28/2015   K 5.0 06/28/2015   CL 105 06/28/2015   CREATININE 0.80 06/28/2015   BUN 17 06/28/2015   CO2 32 06/28/2015   TSH 1.05 06/28/2015    Lab Results  Component  Value Date   TSH 1.05 06/28/2015   Lab Results  Component Value Date   WBC 6.2 06/28/2015   HGB 14.1 06/28/2015   HCT 41.2 06/28/2015   MCV 88.9 06/28/2015   PLT 206.0 06/28/2015   Lab Results  Component Value Date   NA 142 06/28/2015   K 5.0 06/28/2015   CO2 32 06/28/2015   GLUCOSE 89 06/28/2015   BUN 17 06/28/2015   CREATININE 0.80 06/28/2015   BILITOT 0.5 06/28/2015   ALKPHOS 53 06/28/2015   AST 19 06/28/2015   ALT 12 06/28/2015   PROT 7.4 06/28/2015   ALBUMIN 4.4 06/28/2015   CALCIUM 10.5 06/28/2015   GFR 76.02 06/28/2015   Lab Results  Component Value Date   CHOL 154 06/28/2015   Lab Results  Component Value Date   HDL 56.20 06/28/2015   Lab Results  Component Value Date   LDLCALC 69 06/28/2015   Lab Results  Component Value Date   TRIG 146.0 06/28/2015   Lab Results  Component Value Date   CHOLHDL 3 06/28/2015   No results found for: HGBA1C       Assessment & Plan:   Problem List Items Addressed This Visit      Unprioritized   Hyperlipidemia LDL goal <100    Tolerating statin, encouraged heart healthy diet, avoid trans fats, minimize simple carbs and saturated fats. Increase exercise as tolerated      Relevant Orders   Lipid panel   CBC with Differential/Platelet   Comprehensive metabolic panel   POCT Urinalysis Dipstick (Automated)    Other chest pain - Primary    Pt did not want to have ekg today She wanted to come back to have it done She will return Monday for labs and ekg  If cp returns -- go to ER      Relevant Orders   ECHOCARDIOGRAM STRESS TEST   Lipid panel   CBC with Differential/Platelet   Comprehensive metabolic panel   POCT Urinalysis Dipstick (Automated)   EKG 12-Lead   Essential hypertension    Fluctuating , increase metoprolol to 100 mg bid  Encouraged heart healthy diet such as the DASH diet and exercise as tolerated.       Relevant Orders   Lipid panel   CBC with Differential/Platelet   Comprehensive metabolic panel   POCT Urinalysis Dipstick (Automated)    Other Visit Diagnoses    Need for hepatitis C screening test       Relevant Orders   Hepatitis C antibody      I am having Ms. Derstine maintain her aspirin, Fish Oil, glucosamine-chondroitin, Vitamin D3, calcium carbonate, fluticasone, and simvastatin.  No orders of the defined types were placed in this encounter.   CMA served as Education administrator during this visit. History, Physical and Plan performed by medical provider. Documentation and orders reviewed and attested to.  Rejeana Held, DO

## 2016-07-09 NOTE — Patient Instructions (Signed)

## 2016-07-09 NOTE — Assessment & Plan Note (Addendum)
Fluctuating , increase metoprolol to 100 mg bid  Encouraged heart healthy diet such as the DASH diet and exercise as tolerated.

## 2016-07-16 ENCOUNTER — Other Ambulatory Visit: Payer: Self-pay | Admitting: Family Medicine

## 2016-07-16 ENCOUNTER — Ambulatory Visit (INDEPENDENT_AMBULATORY_CARE_PROVIDER_SITE_OTHER): Payer: Medicare Other | Admitting: Family Medicine

## 2016-07-16 ENCOUNTER — Other Ambulatory Visit (INDEPENDENT_AMBULATORY_CARE_PROVIDER_SITE_OTHER): Payer: Medicare Other

## 2016-07-16 DIAGNOSIS — R0789 Other chest pain: Secondary | ICD-10-CM

## 2016-07-16 DIAGNOSIS — I10 Essential (primary) hypertension: Secondary | ICD-10-CM

## 2016-07-16 DIAGNOSIS — E785 Hyperlipidemia, unspecified: Secondary | ICD-10-CM

## 2016-07-16 DIAGNOSIS — Z1159 Encounter for screening for other viral diseases: Secondary | ICD-10-CM | POA: Diagnosis not present

## 2016-07-16 LAB — POC URINALSYSI DIPSTICK (AUTOMATED)
BILIRUBIN UA: NEGATIVE
Clarity, UA: NEGATIVE
GLUCOSE UA: NEGATIVE
Ketones, UA: NEGATIVE
Leukocytes, UA: NEGATIVE
Nitrite, UA: NEGATIVE
Protein, UA: NEGATIVE
RBC UA: NEGATIVE
SPEC GRAV UA: 1.025 (ref 1.010–1.025)
Urobilinogen, UA: 0.2 E.U./dL
pH, UA: 6 (ref 5.0–8.0)

## 2016-07-16 LAB — CBC WITH DIFFERENTIAL/PLATELET
BASOS ABS: 0 10*3/uL (ref 0.0–0.1)
Basophils Relative: 0.5 % (ref 0.0–3.0)
EOS ABS: 0.1 10*3/uL (ref 0.0–0.7)
Eosinophils Relative: 2.1 % (ref 0.0–5.0)
HCT: 42.1 % (ref 36.0–46.0)
Hemoglobin: 14.7 g/dL (ref 12.0–15.0)
LYMPHS ABS: 1.5 10*3/uL (ref 0.7–4.0)
LYMPHS PCT: 30.5 % (ref 12.0–46.0)
MCHC: 34.8 g/dL (ref 30.0–36.0)
MCV: 91 fl (ref 78.0–100.0)
Monocytes Absolute: 0.4 10*3/uL (ref 0.1–1.0)
Monocytes Relative: 8.8 % (ref 3.0–12.0)
NEUTROS ABS: 2.9 10*3/uL (ref 1.4–7.7)
NEUTROS PCT: 58.1 % (ref 43.0–77.0)
PLATELETS: 146 10*3/uL — AB (ref 150.0–400.0)
RBC: 4.62 Mil/uL (ref 3.87–5.11)
RDW: 13 % (ref 11.5–15.5)
WBC: 5 10*3/uL (ref 4.0–10.5)

## 2016-07-16 LAB — COMPREHENSIVE METABOLIC PANEL
ALT: 16 U/L (ref 0–35)
AST: 21 U/L (ref 0–37)
Albumin: 4.5 g/dL (ref 3.5–5.2)
Alkaline Phosphatase: 53 U/L (ref 39–117)
BILIRUBIN TOTAL: 0.6 mg/dL (ref 0.2–1.2)
BUN: 13 mg/dL (ref 6–23)
CO2: 29 meq/L (ref 19–32)
CREATININE: 0.69 mg/dL (ref 0.40–1.20)
Calcium: 9.8 mg/dL (ref 8.4–10.5)
Chloride: 105 mEq/L (ref 96–112)
GFR: 89.89 mL/min (ref >60.00)
GLUCOSE: 100 mg/dL — AB (ref 70–99)
Potassium: 4 mEq/L (ref 3.5–5.1)
Sodium: 141 mEq/L (ref 135–145)
Total Protein: 6.9 g/dL (ref 6.0–8.3)

## 2016-07-16 LAB — LIPID PANEL
CHOL/HDL RATIO: 3
CHOLESTEROL: 174 mg/dL (ref 0–200)
HDL: 50.6 mg/dL (ref >39.00)
NonHDL: 123.81
TRIGLYCERIDES: 312 mg/dL — AB (ref 0.0–149.0)
VLDL: 62.4 mg/dL — AB (ref 0.0–40.0)

## 2016-07-16 LAB — LDL CHOLESTEROL, DIRECT: LDL DIRECT: 77 mg/dL

## 2016-07-16 LAB — HEPATITIS C ANTIBODY: HCV Ab: NEGATIVE

## 2016-07-16 MED ORDER — METOPROLOL TARTRATE 100 MG PO TABS: ORAL_TABLET | ORAL | 1 refills | Status: DC

## 2016-07-16 NOTE — Progress Notes (Signed)
Pt in today (07/16/16) for EKG per order from Dr. Carollee Herter as stated in [place] on 07/09/16. Pt denies having chest pain or SOB today.   EKG completed.  EKG printed and forwarded to Dr. Carollee Herter.        Per Dr. Etter Sjogren EKG normal no future orders placed.  Notified Pt.

## 2016-07-23 ENCOUNTER — Telehealth: Payer: Self-pay | Admitting: *Deleted

## 2016-07-23 DIAGNOSIS — R079 Chest pain, unspecified: Secondary | ICD-10-CM

## 2016-07-23 DIAGNOSIS — E785 Hyperlipidemia, unspecified: Secondary | ICD-10-CM

## 2016-07-23 MED ORDER — SIMVASTATIN 20 MG PO TABS: 20.0000 mg | ORAL_TABLET | Freq: Every day | ORAL | 3 refills | Status: DC

## 2016-07-23 NOTE — Telephone Encounter (Signed)
Order placed

## 2016-07-23 NOTE — Telephone Encounter (Signed)
-----   Message from Mosie Lukes, MD sent at 07/22/2016  9:59 PM EDT ----- Regarding: RE: stress echo Please let patient know they are saying we have to do a regular echo before we can get a stress echo approved. If she agrees we can order echo for chest pain ----- Message ----- From: Gretta Began Sent: 07/22/2016   2:14 PM To: Shariyah Held, DO Subject: stress echo                                    I sent the wrong chart. I am sorry. This patient is scheduled for a stress echocardiogram on 08/07/2016. She will need to have an transthoracic echo ordered and performed before this date in order to have her stress echo performed.   Have a great day.

## 2016-07-28 ENCOUNTER — Other Ambulatory Visit: Payer: Self-pay | Admitting: Family Medicine

## 2016-07-28 DIAGNOSIS — R079 Chest pain, unspecified: Secondary | ICD-10-CM

## 2016-08-01 ENCOUNTER — Telehealth (HOSPITAL_COMMUNITY): Payer: Self-pay | Admitting: *Deleted

## 2016-08-01 NOTE — Telephone Encounter (Signed)
Patient given detailed instructions per Stress Test Requisition Sheet for test on 08/07/16 at 2:30.Patient Notified to arrive 30 minutes early, and that it is imperative to arrive on time for appointment to keep from having the test rescheduled.  Patient verbalized understanding. Veronia Beets

## 2016-08-07 ENCOUNTER — Ambulatory Visit (HOSPITAL_COMMUNITY): Payer: Medicare Other

## 2016-08-07 ENCOUNTER — Encounter (HOSPITAL_COMMUNITY): Payer: Self-pay | Admitting: *Deleted

## 2016-08-07 ENCOUNTER — Ambulatory Visit (HOSPITAL_COMMUNITY): Payer: Medicare Other | Attending: Cardiology

## 2016-08-07 ENCOUNTER — Ambulatory Visit (HOSPITAL_BASED_OUTPATIENT_CLINIC_OR_DEPARTMENT_OTHER): Payer: Medicare Other

## 2016-08-07 ENCOUNTER — Other Ambulatory Visit: Payer: Self-pay

## 2016-08-07 ENCOUNTER — Encounter (HOSPITAL_COMMUNITY): Payer: Self-pay

## 2016-08-07 DIAGNOSIS — I340 Nonrheumatic mitral (valve) insufficiency: Secondary | ICD-10-CM | POA: Diagnosis not present

## 2016-08-07 DIAGNOSIS — I10 Essential (primary) hypertension: Secondary | ICD-10-CM | POA: Insufficient documentation

## 2016-08-07 DIAGNOSIS — R0789 Other chest pain: Secondary | ICD-10-CM | POA: Diagnosis not present

## 2016-08-07 DIAGNOSIS — R079 Chest pain, unspecified: Secondary | ICD-10-CM

## 2016-08-07 NOTE — Progress Notes (Unsigned)
Could not perform stress test due to patient BP (207/110). I took BP 26mns after Echo was performed it was 215/113. Patient also held beta blocker per instructions for stress test.

## 2016-08-08 ENCOUNTER — Other Ambulatory Visit (HOSPITAL_COMMUNITY): Payer: Medicare Other

## 2016-08-09 ENCOUNTER — Other Ambulatory Visit: Payer: Self-pay | Admitting: Family Medicine

## 2016-08-09 DIAGNOSIS — R011 Cardiac murmur, unspecified: Secondary | ICD-10-CM

## 2016-08-09 DIAGNOSIS — I5189 Other ill-defined heart diseases: Secondary | ICD-10-CM

## 2016-10-01 ENCOUNTER — Encounter: Payer: Self-pay | Admitting: *Deleted

## 2016-10-07 DIAGNOSIS — H33303 Unspecified retinal break, bilateral: Secondary | ICD-10-CM | POA: Diagnosis not present

## 2016-10-07 DIAGNOSIS — H524 Presbyopia: Secondary | ICD-10-CM | POA: Diagnosis not present

## 2016-10-07 DIAGNOSIS — H2513 Age-related nuclear cataract, bilateral: Secondary | ICD-10-CM | POA: Diagnosis not present

## 2016-10-07 DIAGNOSIS — H43813 Vitreous degeneration, bilateral: Secondary | ICD-10-CM | POA: Diagnosis not present

## 2016-10-10 NOTE — Progress Notes (Signed)
Cardiology Office Note   Date:  10/14/2016   ID:  Tiffany Graham, DOB 1950-04-10, MRN [id number]  PCP:  Carollee Herter, Alferd Apa, DO  Cardiologist:   Jenkins Rouge, MD   No chief complaint on file.     History of Present Illness: Tiffany Graham is a 66 y.o. female who presents for consultation regarding diastolic dysfunction and murmur. Referred by Dr Etter Sjogren.  CRF; include HLD and HTN. Previous smoker quit over 30 years ago Echo ordered 08/07/16 for chest pain. It looks like stress echo was meant but onlty resting TTE done   Reviewed  EF 60-65% Grade one diastolic  Mild MR Trivial TR Est PA 38 mmHg  BP is poorly controlled Could not have stress echo when holding beta blocker systolic over 675 mmHg Home readings frequently with systolic over 449. Concerned about family history of heart trouble with PAF. No recent chest pain Stays active husband in sales and retiring soon They have a house in [place]    Past Medical History:  Diagnosis Date  . Hyperlipidemia   . Hypertension   . 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 APPENDECTOMY  1991  . CHOLECYSTECTOMY  2004  . TOTAL ABDOMINAL HYSTERECTOMY  1991   with BSO, secondary fibroids  . TUBAL LIGATION  1987   first one 1974, then reversed 1983     Current Outpatient Prescriptions  Medication Sig Dispense Refill  . aspirin 81 MG tablet Take 81 mg by mouth daily.    . calcium carbonate (OS-CAL) 600 MG TABS tablet Take 600 mg by mouth 2 (two) times daily with a meal.    . Cholecalciferol (VITAMIN D3) 1000 UNITS CAPS Take by mouth.    . Coenzyme Q10 (CO Q-10) 200 MG CAPS Take 1 tablet by mouth daily.    . Glucosamine HCl (GLUCOSAMINE PO) Take 1 tablet by mouth daily.    . Omega-3 Fatty Acids (FISH OIL) 1000 MG CAPS Take 1 capsule by mouth daily.    . simvastatin (ZOCOR) 20 MG tablet Take 1 tablet (20 mg total) by mouth at bedtime. 90 tablet 3  .  losartan-hydrochlorothiazide (HYZAAR) 100-12.5 MG tablet Take 1 tablet by mouth daily. 90 tablet 3  . metoprolol succinate (TOPROL-XL) 50 MG 24 hr tablet Take 1 tablet (50 mg total) by mouth daily. Take with or immediately following a meal. 90 tablet 3   No current facility-administered medications for this visit.     Allergies:   Triprolidine-pseudoephedrine    Social History:  The patient  reports that she quit smoking about 35 years ago. She has never used smokeless tobacco. She reports that she drinks about 2.4 oz of alcohol per week . She reports that she does not use drugs.   Family History:  The patient's family history includes Asthma in her unknown relative; Atrial fibrillation in her brother, father, and other; CAD in her father; Clotting disorder in her unknown relative; Colon cancer in her unknown relative; Colon cancer (age of onset: 33) in her father; Coronary artery disease in her unknown relative; Heart attack (age of onset: 38) in her mother; Hyperlipidemia in her unknown relative; Hypertension in her unknown relative; Osteoporosis in her unknown relative; Skin cancer in her brother; Thyroid disease in her unknown relative.    ROS:  Please see the history of present illness.   Otherwise, review of systems are positive for none.   All  other systems are reviewed and negative.    PHYSICAL EXAM: VS:  BP (!) 164/96   Pulse 68   Ht 5' 4"  (1.626 m)   Wt 151 lb 6.4 oz (68.7 kg)   SpO2 97%   BMI 25.99 kg/m  , BMI Body mass index is 25.99 kg/m. Affect appropriate Healthy:  appears stated age 81: normal Neck supple with no adenopathy JVP normal no bruits no thyromegaly Lungs clear with no wheezing and good diaphragmatic motion Heart:  S1/S2 no murmur, no rub, gallop or click PMI normal Abdomen: benighn, BS positve, no tenderness, no AAA no bruit.  No HSM or HJR Distal pulses intact with no bruits No edema Neuro non-focal Skin warm and dry No muscular  weakness    EKG:  SR rate 56 normal    Recent Labs: 07/16/2016: ALT 16; BUN 13; Creatinine, Ser 0.69; Hemoglobin 14.7; Platelets 146.0; Potassium 4.0; Sodium 141    Lipid Panel    Component Value Date/Time   CHOL 174 07/16/2016 0917   TRIG 312.0 (H) 07/16/2016 0917   HDL 50.60 07/16/2016 0917   CHOLHDL 3 07/16/2016 0917   VLDL 62.4 (H) 07/16/2016 0917   LDLCALC 69 06/28/2015 0926   LDLDIRECT 77.0 07/16/2016 0917      Wt Readings from Last 3 Encounters:  10/14/16 151 lb 6.4 oz (68.7 kg)  07/09/16 148 lb 3.2 oz (67.2 kg)  07/08/16 149 lb 3.2 oz (67.7 kg)      Other studies Reviewed: Additional studies/ records that were reviewed today include: notes Dr Lown Echo labs and ECG.    ASSESSMENT AND PLAN:  1.  Chest pain resolved will consider ETT once BP under better control Calcium score ordered 2. MR mild not clinically significant no need for SBE no murmur on exam  3. Diastolic dysfucntion related to age and poorly controlled BP 4. HTN d/c metroprolol start Toprol and Hyzaar. F/u BMET 3 weeks f/u with me after that  5. Cholesterol Rx will be based on calcium score no documented vascular disease    Current medicines are reviewed at length with the patient today.  The patient does not have concerns regarding medicines.  The following changes have been made:  Toprol 50 mg Hyzaar 100/12.5 d/c lopressor   Labs/ tests ordered today include: BMET   Orders Placed This Encounter  Procedures  . Basic metabolic panel     Disposition:   FU with me 8-10 weeks   Calcium score Med change BMET     Signed, Jenkins Rouge, MD  10/14/2016 9:56 AM    [facility] [address], [place], [place]  [address] Phone: [phone]; Fax: [phone]

## 2016-10-14 ENCOUNTER — Encounter: Payer: Self-pay | Admitting: Cardiovascular Disease

## 2016-10-14 ENCOUNTER — Ambulatory Visit (INDEPENDENT_AMBULATORY_CARE_PROVIDER_SITE_OTHER): Payer: Medicare Other | Admitting: Cardiovascular Disease

## 2016-10-14 VITALS — BP 164/96 | HR 68 | Ht 64.0 in | Wt 151.4 lb

## 2016-10-14 DIAGNOSIS — I10 Essential (primary) hypertension: Secondary | ICD-10-CM

## 2016-10-14 DIAGNOSIS — Z79899 Other long term (current) drug therapy: Secondary | ICD-10-CM

## 2016-10-14 MED ORDER — LOSARTAN POTASSIUM-HCTZ 100-12.5 MG PO TABS: 1.0000 | ORAL_TABLET | Freq: Every day | ORAL | 3 refills | Status: DC

## 2016-10-14 MED ORDER — METOPROLOL SUCCINATE ER 50 MG PO TB24: 50.0000 mg | ORAL_TABLET | Freq: Every day | ORAL | 3 refills | Status: DC

## 2016-10-14 NOTE — Patient Instructions (Addendum)
Medication Instructions:  Your physician has recommended you make the following change in your medication:  1-START Toprol (Metoprolol Succinate) 50 mg by mouth daily in the morning 2-START Hyzaar 100/12.5 mg by mouth daily in the afternoon 3-STOP Metoprolol Titrate   Labwork: Your physician recommends that you return for lab work in: 3 weeks for BMET  Testing/Procedures: Cardiac CT scanning Calcium score, (CAT scanning), is a noninvasive, special x-ray that produces cross-sectional images of the body using x-rays and a computer. CT scans help physicians diagnose and treat medical conditions. For some CT exams, a contrast material is used to enhance visibility in the area of the body being studied. CT scans provide greater clarity and reveal more details than regular x-ray exams. \ Follow-Up: Your physician wants you to follow-up in: 8 to 10 weeks with Dr. Johnsie Cancel or APP.  If you need a refill on your cardiac medications before your next appointment, please call your pharmacy.   Hydrochlorothiazide, HCTZ; Losartan tablets What is this medicine? LOSARTAN; HYDROCHLOROTHIAZIDE (loe SAR tan; hye droe klor oh THYE a zide) is a combination of a drug that relaxes blood vessels and a diuretic. It is used to treat high blood pressure. This medicine may also reduce the risk of stroke in certain patients. This medicine may be used for other purposes; ask your health care provider or pharmacist if you have questions. COMMON BRAND NAME(S): Hyzaar What should I tell my health care provider before I take this medicine? They need to know if you have any of these conditions: -decreased urine -kidney disease -liver disease -if you are on a special diet, like a low-salt diet -immune system problems, like lupus -an unusual or allergic reaction to losartan, hydrochlorothiazide, sulfa drugs, other medicines, foods, dyes, or preservatives -pregnant or trying to get pregnant -breast-feeding How should I use  this medicine? Take this medicine by mouth with a glass of water. Follow the directions on the prescription label. You can take it with or without food. If it upsets your stomach, take it with food. Take your medicine at regular intervals. Do not take it more often than directed. Do not stop taking except on your doctor's advice. Talk to your pediatrician regarding the use of this medicine in children. Special care may be needed. Overdosage: If you think you have taken too much of this medicine contact a poison control center or emergency room at once. NOTE: This medicine is only for you. Do not share this medicine with others. What if I miss a dose? If you miss a dose, take it as soon as you can. If it is almost time for your next dose, take only that dose. Do not take double or extra doses. What may interact with this medicine? -barbiturates, like phenobarbital -blood pressure medicines -celecoxib -cimetidine -corticosteroids -diabetic medicines -diuretics, especially triamterene, spironolactone or amiloride -fluconazole -lithium -NSAIDs, medicines for pain and inflammation, like ibuprofen or naproxen -potassium salts or potassium supplements -prescription pain medicines -rifampin -skeletal muscle relaxants like tubocurarine -some cholesterol-lowering medicines like cholestyramine or colestipol This list may not describe all possible interactions. Give your health care provider a list of all the medicines, herbs, non-prescription drugs, or dietary supplements you use. Also tell them if you smoke, drink alcohol, or use illegal drugs. Some items may interact with your medicine. What should I watch for while using this medicine? Check your blood pressure regularly while you are taking this medicine. Ask your doctor or health care professional what your blood pressure should be and  when you should contact him or her. When you check your blood pressure, write down the measurements to show your  doctor or health care professional. If you are taking this medicine for a long time, you must visit your health care professional for regular checks on your progress. Make sure you schedule appointments on a regular basis. You must not get dehydrated. Ask your doctor or health care professional how much fluid you need to drink a day. Check with him or her if you get an attack of severe diarrhea, nausea and vomiting, or if you sweat a lot. The loss of too much body fluid can make it dangerous for you to take this medicine. Women should inform their doctor if they wish to become pregnant or think they might be pregnant. There is a potential for serious side effects to an unborn child, particularly in the second or third trimester. Talk to your health care professional or pharmacist for more information. You may get drowsy or dizzy. Do not drive, use machinery, or do anything that needs mental alertness until you know how this drug affects you. Do not stand or sit up quickly, especially if you are an older patient. This reduces the risk of dizzy or fainting spells. Alcohol can make you more drowsy and dizzy. Avoid alcoholic drinks. This medicine may affect your blood sugar level. If you have diabetes, check with your doctor or health care professional before changing the dose of your diabetic medicine. Avoid salt substitutes unless you are told otherwise by your doctor or health care professional. Do not treat yourself for coughs, colds, or pain while you are taking this medicine without asking your doctor or health care professional for advice. Some ingredients may increase your blood pressure. What side effects may I notice from receiving this medicine? Side effects that you should report to your doctor or health care professional as soon as possible: -allergic reactions like skin rash, itching or hives, swelling of the face, lips, or tongue -breathing problems -changes in vision -dark urine -eye  pain -fast or irregular heart beat, palpitations, or chest pain -feeling faint or lightheaded -muscle cramps -persistent dry cough -redness, blistering, peeling or loosening of the skin, including inside the mouth -stomach pain -trouble passing urine or change in the amount of urine -unusual bleeding or bruising -worsened gout pain -yellowing of the eyes or skin Side effects that usually do not require medical attention (report to your doctor or health care professional if they continue or are bothersome): -change in sex drive or performance -headache This list may not describe all possible side effects. Call your doctor for medical advice about side effects. You may report side effects to FDA at 1-800-FDA-1088. Where should I keep my medicine? Keep out of the reach of children. Store at room temperature between 15 and 30 degrees C (59 and 86 degrees F). Protect from light. Keep container tightly closed. Throw away any unused medicine after the expiration date. NOTE: This sheet is a summary. It may not cover all possible information. If you have questions about this medicine, talk to your doctor, pharmacist, or health care provider.  2018 [employer]vier/Gold Standard (2009-09-27 13:57:32)

## 2016-10-15 DIAGNOSIS — D225 Melanocytic nevi of trunk: Secondary | ICD-10-CM | POA: Diagnosis not present

## 2016-10-15 DIAGNOSIS — L570 Actinic keratosis: Secondary | ICD-10-CM | POA: Diagnosis not present

## 2016-10-15 DIAGNOSIS — Z85828 Personal history of other malignant neoplasm of skin: Secondary | ICD-10-CM | POA: Diagnosis not present

## 2016-10-15 DIAGNOSIS — L218 Other seborrheic dermatitis: Secondary | ICD-10-CM | POA: Diagnosis not present

## 2016-11-08 ENCOUNTER — Other Ambulatory Visit: Payer: Medicare Other | Admitting: *Deleted

## 2016-11-08 ENCOUNTER — Ambulatory Visit (INDEPENDENT_AMBULATORY_CARE_PROVIDER_SITE_OTHER)
Admission: RE | Admit: 2016-11-08 | Discharge: 2016-11-08 | Disposition: A | Discharge status: Home / Self Care | Payer: Self-pay | Source: Ambulatory Visit | Attending: Cardiovascular Disease | Admitting: Cardiovascular Disease

## 2016-11-08 ENCOUNTER — Telehealth: Payer: Self-pay

## 2016-11-08 DIAGNOSIS — I10 Essential (primary) hypertension: Secondary | ICD-10-CM | POA: Diagnosis not present

## 2016-11-08 DIAGNOSIS — Z79899 Other long term (current) drug therapy: Secondary | ICD-10-CM | POA: Diagnosis not present

## 2016-11-08 LAB — BASIC METABOLIC PANEL
BUN / CREAT RATIO: 17 (ref 12–28)
BUN: 12 mg/dL (ref 8–27)
CHLORIDE: 102 mmol/L (ref 96–106)
CO2: 25 mmol/L (ref 20–29)
Calcium: 9.6 mg/dL (ref 8.7–10.3)
Creatinine, Ser: 0.69 mg/dL (ref 0.57–1.00)
GFR calc non Af Amer: 90 mL/min/{1.73_m2} (ref >59)
GFR, EST AFRICAN AMERICAN: 103 mL/min/{1.73_m2} (ref >59)
Glucose: 107 mg/dL — ABNORMAL HIGH (ref 65–99)
POTASSIUM: 3.9 mmol/L (ref 3.5–5.2)
Sodium: 143 mmol/L (ref 134–144)

## 2016-11-08 NOTE — Telephone Encounter (Signed)
Patient was here for her BMET and CT.  Patient filled out form for walk-in. Patient complained of not feeling well after starting now medications. Patient has dizziness, no energy, and feels bad. BPs 150/60 149/68 148/57  HR 60-70. Patient was taking metoprolol 100 mg BID and She was switched to Metoprolol 50 mg daily and Hyzaar 100/12.5 mg at last office visit 10/14/16, BP was 164/96 and HR was 68 at that time.  DOD consulted and referred to primary cardiologist. Informed patient of recommendations and she feels fine to wait to hear from Dr. Johnsie Cancel when he is back on Tuesday.

## 2016-11-11 NOTE — Telephone Encounter (Signed)
Would continue current meds and check BMET f/u HTN clinic this week

## 2016-11-11 NOTE — Telephone Encounter (Signed)
Left message for patient to call back  

## 2016-11-12 NOTE — Telephone Encounter (Signed)
Follow up     Pt is returning call to RN.

## 2016-11-12 NOTE — Telephone Encounter (Signed)
Called patient back. Patient is unable to come in this week. Made patient an appointment next Monday at [facility] office for HTN clinic. Patient had recent lab work on 11/08/16 everything in normal range except glucose slightly elevated. Patient verbalized understanding and will keep HTN clinic appt on Monday.

## 2016-11-18 ENCOUNTER — Ambulatory Visit (INDEPENDENT_AMBULATORY_CARE_PROVIDER_SITE_OTHER): Payer: Medicare Other | Admitting: Pharmacist

## 2016-11-18 VITALS — BP 136/78 | HR 62 | Wt 153.2 lb

## 2016-11-18 DIAGNOSIS — I10 Essential (primary) hypertension: Secondary | ICD-10-CM

## 2016-11-18 NOTE — Patient Instructions (Addendum)
Return for a  follow up appointment in 4 week  Your blood pressure today is 136/78 pulse 62  Check your blood pressure at home daily (if able) and keep record of the readings.  Take your BP meds as follows: *Change metoprolol succinate for evening (take with evening meal)* *Continue all other medication as prescribed* *Start working on [employer] and lifestyle modifications*   Bring your BP cuff and your record of home blood pressures to your next appointment.  Exercise as you're able, try to walk approximately 30 minutes per day.  Keep salt intake to a minimum, especially watch canned and prepared boxed foods.  Eat more fresh fruits and vegetables and fewer canned items.  Avoid eating in fast food restaurants.    HOW TO TAKE YOUR BLOOD PRESSURE: . Rest 5 minutes before taking your blood pressure. .  Don't smoke or drink caffeinated beverages for at least 30 minutes before. . Take your blood pressure before (not after) you eat. . Sit comfortably with your back supported and both feet on the floor (don't cross your legs). . Elevate your arm to heart level on a table or a desk. . Use the proper sized cuff. It should fit smoothly and snugly around your bare upper arm. There should be enough room to slip a fingertip under the cuff. The bottom edge of the cuff should be 1 inch above the crease of the elbow. . Ideally, take 3 measurements at one sitting and record the average.   Food Choices to Lower Your Triglycerides Triglycerides are a type of fat in your blood. High levels of triglycerides can increase the risk of heart disease and stroke. If your triglyceride levels are high, the foods you eat and your eating habits are very important. Choosing the right foods can help lower your triglycerides. What general guidelines do I need to follow?  Lose weight if you are overweight.  Limit or avoid alcohol.  Fill one half of your plate with vegetables and green salads.  Limit fruit to two  servings a day. Choose fruit instead of juice.  Make one fourth of your plate whole grains. Look for the word "whole" as the first word in the ingredient list.  Fill one fourth of your plate with lean protein foods.  Enjoy fatty fish (such as salmon, mackerel, sardines, and tuna) three times a week.  Choose healthy fats.  Limit foods high in starch and sugar.  Eat more home-cooked food and less restaurant, buffet, and fast food.  Limit fried foods.  Cook foods using methods other than frying.  Limit saturated fats.  Check ingredient lists to avoid foods with partially hydrogenated oils (trans fats) in them. What foods can I eat? Grains Whole grains, such as whole wheat or whole grain breads, crackers, cereals, and pasta. Unsweetened oatmeal, bulgur, barley, quinoa, or brown rice. Corn or whole wheat flour tortillas. Vegetables Fresh or frozen vegetables (raw, steamed, roasted, or grilled). Green salads. Fruits All fresh, canned (in natural juice), or frozen fruits. Meat and Other Protein Products Ground beef (85% or leaner), grass-fed beef, or beef trimmed of fat. Skinless chicken or [place]. Ground chicken or [place]. Pork trimmed of fat. All fish and seafood. Eggs. Dried beans, peas, or lentils. Unsalted nuts or seeds. Unsalted canned or dry beans. Dairy Low-fat dairy products, such as skim or 1% milk, 2% or reduced-fat cheeses, low-fat ricotta or cottage cheese, or plain low-fat yogurt. Fats and Oils Tub margarines without trans fats. Light or reduced-fat mayonnaise  and salad dressings. Avocado. Safflower, olive, or canola oils. Natural peanut or almond butter. The items listed above may not be a complete list of recommended foods or beverages. Contact your dietitian for more options. What foods are not recommended? Grains White bread. White pasta. White rice. Cornbread. Bagels, pastries, and croissants. Crackers that contain trans fat. Vegetables White potatoes. Corn. Creamed  or fried vegetables. Vegetables in a cheese sauce. Fruits Dried fruits. Canned fruit in light or heavy syrup. Fruit juice. Meat and Other Protein Products Fatty cuts of meat. Ribs, chicken wings, bacon, sausage, bologna, salami, chitterlings, fatback, hot dogs, bratwurst, and packaged luncheon meats. Dairy Whole or 2% milk, cream, half-and-half, and cream cheese. Whole-fat or sweetened yogurt. Full-fat cheeses. Nondairy creamers and whipped toppings. Processed cheese, cheese spreads, or cheese curds. Sweets and Desserts Corn syrup, sugars, honey, and molasses. Candy. Jam and jelly. Syrup. Sweetened cereals. Cookies, pies, cakes, donuts, muffins, and ice cream. Fats and Oils Butter, stick margarine, lard, shortening, ghee, or bacon fat. Coconut, palm kernel, or palm oils. Beverages Alcohol. Sweetened drinks (such as sodas, lemonade, and fruit drinks or punches). The items listed above may not be a complete list of foods and beverages to avoid. Contact your dietitian for more information. This information is not intended to replace advice given to you by your health care provider. Make sure you discuss any questions you have with your health care provider. Document Released: 10/26/2003 Document Revised: 06/15/2015 Document Reviewed: 11/11/2012 Elsevier Interactive Patient Education  2017 [place] DASH stands for "Dietary Approaches to Stop Hypertension." The DASH eating plan is a healthy eating plan that has been shown to reduce high blood pressure (hypertension). It may also reduce your risk for type 2 diabetes, heart disease, and stroke. The DASH eating plan may also help with weight loss. What are tips for following this plan? General guidelines  Avoid eating more than 2,300 mg (milligrams) of salt (sodium) a day. If you have hypertension, you may need to reduce your sodium intake to 1,500 mg a day.  Limit alcohol intake to no more than 1 drink a day for  nonpregnant women and 2 drinks a day for men. One drink equals 12 oz of beer, 5 oz of wine, or 1 oz of hard liquor.  Work with your health care provider to maintain a healthy body weight or to lose weight. Ask what an ideal weight is for you.  Get at least 30 minutes of exercise that causes your heart to beat faster (aerobic exercise) most days of the week. Activities may include walking, swimming, or biking.  Work with your health care provider or diet and nutrition specialist (dietitian) to adjust your eating plan to your individual calorie needs. Reading food labels  Check food labels for the amount of sodium per serving. Choose foods with less than 5 percent of the Daily Value of sodium. Generally, foods with less than 300 mg of sodium per serving fit into this eating plan.  To find whole grains, look for the word "whole" as the first word in the ingredient list. Shopping  Buy products labeled as "low-sodium" or "no salt added."  Buy fresh foods. Avoid canned foods and premade or frozen meals. Cooking  Avoid adding salt when cooking. Use salt-free seasonings or herbs instead of table salt or sea salt. Check with your health care provider or pharmacist before using salt substitutes.  Do not fry foods. Cook foods using healthy methods such as baking, boiling,  grilling, and broiling instead.  Cook with heart-healthy oils, such as olive, canola, soybean, or sunflower oil. Meal planning   Eat a balanced diet that includes: ? 5 or more servings of fruits and vegetables each day. At each meal, try to fill half of your plate with fruits and vegetables. ? Up to 6-8 servings of whole grains each day. ? Less than 6 oz of lean meat, poultry, or fish each day. A 3-oz serving of meat is about the same size as a deck of cards. One egg equals 1 oz. ? 2 servings of low-fat dairy each day. ? A serving of nuts, seeds, or beans 5 times each week. ? Heart-healthy fats. Healthy fats called Omega-3  fatty acids are found in foods such as flaxseeds and coldwater fish, like sardines, salmon, and mackerel.  Limit how much you eat of the following: ? Canned or prepackaged foods. ? Food that is high in trans fat, such as fried foods. ? Food that is high in saturated fat, such as fatty meat. ? Sweets, desserts, sugary drinks, and other foods with added sugar. ? Full-fat dairy products.  Do not salt foods before eating.  Try to eat at least 2 vegetarian meals each week.  Eat more home-cooked food and less restaurant, buffet, and fast food.  When eating at a restaurant, ask that your food be prepared with less salt or no salt, if possible. What foods are recommended? The items listed may not be a complete list. Talk with your dietitian about what dietary choices are best for you. Grains Whole-grain or whole-wheat bread. Whole-grain or whole-wheat pasta. Brown rice. [place]. Bulgur. Whole-grain and low-sodium cereals. Pita bread. Low-fat, low-sodium crackers. Whole-wheat flour tortillas. Vegetables Fresh or frozen vegetables (raw, steamed, roasted, or grilled). Low-sodium or reduced-sodium tomato and vegetable juice. Low-sodium or reduced-sodium tomato sauce and tomato paste. Low-sodium or reduced-sodium canned vegetables. Fruits All fresh, dried, or frozen fruit. Canned fruit in natural juice (without added sugar). Meat and other protein foods Skinless chicken or [place]. Ground chicken or [place]. Pork with fat trimmed off. Fish and seafood. Egg whites. Dried beans, peas, or lentils. Unsalted nuts, nut butters, and seeds. Unsalted canned beans. Lean cuts of beef with fat trimmed off. Low-sodium, lean deli meat. Dairy Low-fat (1%) or fat-free (skim) milk. Fat-free, low-fat, or reduced-fat cheeses. Nonfat, low-sodium ricotta or cottage cheese. Low-fat or nonfat yogurt. Low-fat, low-sodium cheese. Fats and oils Soft margarine without trans fats. Vegetable oil. Low-fat, reduced-fat, or  light mayonnaise and salad dressings (reduced-sodium). Canola, safflower, olive, soybean, and sunflower oils. Avocado. Seasoning and other foods Herbs. Spices. Seasoning mixes without salt. Unsalted popcorn and pretzels. Fat-free sweets. What foods are not recommended? The items listed may not be a complete list. Talk with your dietitian about what dietary choices are best for you. Grains Baked goods made with fat, such as croissants, muffins, or some breads. Dry pasta or rice meal packs. Vegetables Creamed or fried vegetables. Vegetables in a cheese sauce. Regular canned vegetables (not low-sodium or reduced-sodium). Regular canned tomato sauce and paste (not low-sodium or reduced-sodium). Regular tomato and vegetable juice (not low-sodium or reduced-sodium). Angie Fava. Olives. Fruits Canned fruit in a light or heavy syrup. Fried fruit. Fruit in cream or butter sauce. Meat and other protein foods Fatty cuts of meat. Ribs. Fried meat. Berniece Salines. Sausage. Bologna and other processed lunch meats. Salami. Fatback. Hotdogs. Bratwurst. Salted nuts and seeds. Canned beans with added salt. Canned or smoked fish. Whole eggs or egg yolks. Chicken  or [place] with skin. Dairy Whole or 2% milk, cream, and half-and-half. Whole or full-fat cream cheese. Whole-fat or sweetened yogurt. Full-fat cheese. Nondairy creamers. Whipped toppings. Processed cheese and cheese spreads. Fats and oils Butter. Stick margarine. Lard. Shortening. Ghee. Bacon fat. Tropical oils, such as coconut, palm kernel, or palm oil. Seasoning and other foods Salted popcorn and pretzels. Onion salt, garlic salt, seasoned salt, table salt, and sea salt. Worcestershire sauce. Tartar sauce. Barbecue sauce. Teriyaki sauce. Soy sauce, including reduced-sodium. Steak sauce. Canned and packaged gravies. Fish sauce. Oyster sauce. Cocktail sauce. Horseradish that you find on the shelf. Ketchup. Mustard. Meat flavorings and tenderizers. Bouillon cubes. Hot  sauce and Tabasco sauce. Premade or packaged marinades. Premade or packaged taco seasonings. Relishes. Regular salad dressings. Where to find more information:  National Heart, Lung, and [place]: https://wilson-eaton.com/  American Heart Association: www.heart.org Summary  The DASH eating plan is a healthy eating plan that has been shown to reduce high blood pressure (hypertension). It may also reduce your risk for type 2 diabetes, heart disease, and stroke.  With the DASH eating plan, you should limit salt (sodium) intake to 2,300 mg a day. If you have hypertension, you may need to reduce your sodium intake to 1,500 mg a day.  When on the DASH eating plan, aim to eat more fresh fruits and vegetables, whole grains, lean proteins, low-fat dairy, and heart-healthy fats.  Work with your health care provider or diet and nutrition specialist (dietitian) to adjust your eating plan to your individual calorie needs. This information is not intended to replace advice given to you by your health care provider. Make sure you discuss any questions you have with your health care provider. Document Released: 12/27/2010 Document Revised: 01/01/2016 Document Reviewed: 01/01/2016 Elsevier Interactive Patient Education  2017 [employer].

## 2016-11-18 NOTE — Progress Notes (Signed)
Patient ID: Tiffany Graham                 DOB: Jun 06, 1950                      MRN: [id number]     HPI: Tiffany Graham is a 66 y.o. female referred by Dr. Eden Emms to HTN clinic. PMH includes hyperlipidemia, uncontrolled hypertension, and diastolic dysfunction. Metoprolol succinate 50mg  daily and losartan/HCTZ 100/12.5mg  daily were started by Dr Eden Emms on 10/14/2016. Repeat BMET after ARB initation shows stable renal function and electrolytes. Patient presents to day for initial HTN clinic evaluation.   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 Previously on metoprolol 100mg  twice daily but feels very fatigued and dizzy 2-3 hours after taking BP medication in the morning. Patient also has lots of questions about supplements and potential drug interactions. Noted TG above goal ;therefore, we also discuss some diet modifications to improve TG.  Current HTN meds:  Metoprolol succinate 50mg  daily (morning) Losartan/HCTZ 100/12.5mg  daily (noon)  Lipid management medication: Simvastatin 20mg  daily Omega-3 fatty acids; 1200mg  daily  Previously tried:  metoprolol tartrate 100mg  twice daily  BP goal: 130/80  Family History: family history includes Afib, CAD ad colon cancer from father; MI from mother; Afib and skin cancer from brother  Social History: former smoker (quit Jan/1993); occasional wine; denies any other drug or tobacco product  Diet: avoid adding salt to cooked food; no other limitations or DASH diet  Exercise: gym 2-3x/week  Home BP readings: 8 readings after initiating new medication; average reading 144/64 (HR 70 bpm)  Relevant Lab values:  07/16/2016: CHO 174; TG 312; HDL 50; LDL-d 77  Wt Readings from Last 3 Encounters:  11/18/16 153 lb 3.2 oz (69.5 kg)  10/14/16 151 lb 6.4 oz (68.7 kg)  07/09/16 148 lb 3.2 oz (67.2 kg)   BP Readings from Last 3 Encounters:  11/18/16 136/78  10/14/16 (!) 164/96  07/09/16 120/66   Pulse Readings from Last 3 Encounters:  11/18/16 62    10/14/16 68  07/09/16 (!) 59    Past Medical History:  Diagnosis Date  . Hyperlipidemia   . Hypertension   . Osteoarthritis of left patellofemoral joint 05/29/2016    Current Outpatient Prescriptions on File Prior to Visit  Medication Sig Dispense Refill  . aspirin 81 MG tablet Take 81 mg by mouth daily.    . calcium carbonate (OS-CAL) 600 MG TABS tablet Take 600 mg by mouth 2 (two) times daily with a meal.    . Cholecalciferol (VITAMIN D3) 1000 UNITS CAPS Take by mouth.    . Coenzyme Q10 (CO Q-10) 200 MG CAPS Take 1 tablet by mouth daily.    . Glucosamine HCl (GLUCOSAMINE PO) Take 1 tablet by mouth daily.    Marland Kitchen losartan-hydrochlorothiazide (HYZAAR) 100-12.5 MG tablet Take 1 tablet by mouth daily. 90 tablet 3  . metoprolol succinate (TOPROL-XL) 50 MG 24 hr tablet Take 1 tablet (50 mg total) by mouth daily. Take with or immediately following a meal. 90 tablet 3  . Omega-3 Fatty Acids (FISH OIL) 1000 MG CAPS Take 1 capsule by mouth daily.    . simvastatin (ZOCOR) 20 MG tablet Take 1 tablet (20 mg total) by mouth at bedtime. 90 tablet 3   No current facility-administered medications on file prior to visit.     Allergies  Allergen Reactions  . Triprolidine-Pseudoephedrine Hives    Ingredient in Actifed    Blood pressure 136/78, pulse 62,  weight 153 lb 3.2 oz (69.5 kg).  Essential hypertension Blood pressure slightly above goal during office visit ,but better controlled since initiation of metoprolol succinate and losartan/HCTZ on Sept/24/2018. Only 8 home BP readings were provided today. Patient reports ADR to metoprolol including increased fatigue and dizziness during the morning hours especially 2-3 hours after taking medication.  We discussed using DASH diet plan, Mediterranean diet, avoid high sodium foods like frozen meals, decrease intake of bread and take-out. Other lifestyle modifications discussed include: walks/aerobic exercises for 30 minutes per day, and decrease alcohol  intake.  Will change metoprolol succinate to evening administration to improve tolerability, and work on lifestyle modifications. Patient instructed to monitor BP twice daily for next 4 weeks and bring records to f/u visit. Plan to add spironolactone 12.5mg  daily or amlodipine 5mg  daily during next appointment if additional BP control needed.   Heidi Maclin Rodriguez-Guzman PharmD, BCPS, CPP [facility] [address] [place] [address] 11/19/2016 8:57 AM

## 2016-11-19 NOTE — Assessment & Plan Note (Signed)
Blood pressure slightly above goal during office visit ,but better controlled since initiation of metoprolol succinate and losartan/HCTZ on Sept/24/2018. Only 8 home BP readings were provided today. Patient reports ADR to metoprolol including increased fatigue and dizziness during the morning hours especially 2-3 hours after taking medication.  We discussed using DASH diet plan, Mediterranean diet, avoid high sodium foods like frozen meals, decrease intake of bread and take-out. Other lifestyle modifications discussed include: walks/aerobic exercises for 30 minutes per day, and decrease alcohol intake.  Will change metoprolol succinate to evening administration to improve tolerability, and work on lifestyle modifications. Patient instructed to monitor BP twice daily for next 4 weeks and bring records to f/u visit. Plan to add spironolactone 12.54m daily or amlodipine 576mdaily during next appointment if additional BP control needed.

## 2016-11-20 DIAGNOSIS — Z23 Encounter for immunization: Secondary | ICD-10-CM | POA: Diagnosis not present

## 2016-11-29 ENCOUNTER — Telehealth: Payer: Self-pay | Admitting: Pharmacist

## 2016-11-29 NOTE — Telephone Encounter (Signed)
Pt call to request recommendation for cold and flu symptoms.  Currently only has runny nose and was able to tolerate nasal spray in the past.   Recommendation:  1. Use nasonex OTC nasal spray for now  2. Okay to use coricidin if additional symptoms develop

## 2016-12-09 ENCOUNTER — Encounter: Payer: Self-pay | Admitting: Nurse Practitioner

## 2016-12-09 ENCOUNTER — Ambulatory Visit (INDEPENDENT_AMBULATORY_CARE_PROVIDER_SITE_OTHER): Payer: Medicare Other | Admitting: Nurse Practitioner

## 2016-12-09 VITALS — BP 150/100 | HR 62 | Ht 64.0 in | Wt 154.0 lb

## 2016-12-09 DIAGNOSIS — Z79899 Other long term (current) drug therapy: Secondary | ICD-10-CM | POA: Diagnosis not present

## 2016-12-09 DIAGNOSIS — I10 Essential (primary) hypertension: Secondary | ICD-10-CM

## 2016-12-09 MED ORDER — AMLODIPINE BESYLATE 5 MG PO TABS: 5.0000 mg | ORAL_TABLET | Freq: Every day | ORAL | 3 refills | Status: DC

## 2016-12-09 NOTE — Patient Instructions (Addendum)
We will be checking the following labs today - NONE   Medication Instructions:    Continue with your current medicines. BUT  I am adding Norvasc 5 mg a day - take this at night. I have sent this to [facility]     Testing/Procedures To Be Arranged:  N/A  Follow-Up:   See mein about 2 months    Other Special Instructions:   See pharmacy as planned - take your BP cuff to get checked.     If you need a refill on your cardiac medications before your next appointment, please call your pharmacy.   Call the [place] office at [phone] if you have any questions, problems or concerns.

## 2016-12-09 NOTE — Progress Notes (Signed)
CARDIOLOGY OFFICE NOTE  Date:  12/09/2016    Tiffany Graham Date of Birth: February 29, 1950 Medical Record #[id number]  PCP:  Kyung Held, DO  Cardiologist:  Johnsie Cancel  Chief Complaint  Patient presents with  . Hypertension    Follow up visit - seen for Dr. Johnsie Cancel    History of Present Illness: Tiffany Graham is a 66 y.o. female who presents today for a 2 month check. Seen for D. Nishan.   She was referred here back earlier this year for diastolic dysfunction and murmur. She has HTN and HLD. Remote smoker but quit over 30 years ago. Echo from 7/18 showed normal EF and grade 1 DD.   Last seen here in September - BP poorly controlled. Was unable to have a stress echo due to holding beta blocker and BP was over 324 systolic.   Seen by pharmacy 2 weeks ago - to add CCB or aldactone on return.   Comes in today. Here alone. Seeing HTN clinic next week at [facility]. Has not had her BP cuff checked. Says it is "old". She says she is doing good. Feels good. Her medicines were switched around - this has helped with the dizziness but BP still not at goal. She has gotten back to the gym. Was just in [place] for a week - she "vacationed". No swelling.    Past Medical History:  Diagnosis Date  . Hyperlipidemia   . Hypertension   . 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 APPENDECTOMY  1991  . CHOLECYSTECTOMY  2004  . TOTAL ABDOMINAL HYSTERECTOMY  1991   with BSO, secondary fibroids  . TUBAL LIGATION  1987   first one 1974, then reversed 1983     Medications: Current Meds  Medication Sig  . aspirin 81 MG tablet Take 81 mg by mouth daily.  . calcium carbonate (OS-CAL) 600 MG TABS tablet Take 600 mg by mouth 2 (two) times daily with a meal.  . Cholecalciferol (VITAMIN D3) 1000 UNITS CAPS Take by mouth.  . Coenzyme Q10 (CO Q-10) 200 MG CAPS Take 1 tablet by mouth daily.  . Glucosamine HCl (GLUCOSAMINE PO)  Take 1 tablet by mouth daily.  Marland Kitchen losartan-hydrochlorothiazide (HYZAAR) 100-12.5 MG tablet Take 1 tablet by mouth daily.  . metoprolol succinate (TOPROL-XL) 50 MG 24 hr tablet Take 1 tablet (50 mg total) by mouth daily. Take with or immediately following a meal.  . Omega-3 Fatty Acids (FISH OIL) 1000 MG CAPS Take 1 capsule by mouth daily.  . simvastatin (ZOCOR) 20 MG tablet Take 1 tablet (20 mg total) by mouth at bedtime.     Allergies: Allergies  Allergen Reactions  . Triprolidine-Pseudoephedrine Hives    Ingredient in Actifed    Social History: The patient  reports that she quit smoking about 35 years ago. she has never used smokeless tobacco. She reports that she drinks about 2.4 oz of alcohol per week. She reports that she does not use drugs.   Family History: The patient's family history includes Asthma in her unknown relative; Atrial fibrillation in her brother, father, and other; CAD in her father; Clotting disorder in her unknown relative; Colon cancer in her unknown relative; Colon cancer (age of onset: 65) in her father; Coronary artery disease in her unknown relative; Heart attack (age of onset: 81) in her mother; Hyperlipidemia in her unknown relative; Hypertension in her unknown relative; Osteoporosis in her  unknown relative; Skin cancer in her brother; Thyroid disease in her unknown relative.   Review of Systems: Please see the history of present illness.   Otherwise, the review of systems is positive for none.   All other systems are reviewed and negative.   Physical Exam: VS:  BP (!) 150/100 (BP Location: Left Arm, Patient Position: Sitting, Cuff Size: Normal)   Pulse 62   Ht 5' 4"  (1.626 m)   Wt 154 lb (69.9 kg)   SpO2 99% Comment: at rest  BMI 26.43 kg/m  .  BMI Body mass index is 26.43 kg/m.  Wt Readings from Last 3 Encounters:  12/09/16 154 lb (69.9 kg)  11/18/16 153 lb 3.2 oz (69.5 kg)  10/14/16 151 lb 6.4 oz (68.7 kg)   BP is 170/110 in the right arm and  180/100 in the left arm by me.   General: Pleasant. Well developed, well nourished and in no acute distress.   HEENT: Normal.  Neck: Supple, no JVD, carotid bruits, or masses noted.  Cardiac: Regular rate and rhythm. No murmurs, rubs, or gallops. No edema.  Respiratory:  Lungs are clear to auscultation bilaterally with normal work of breathing.  GI: Soft and nontender. I do not appreciate any abdominal bruits but her bowel sounds are hyperactive.  MS: No deformity or atrophy. Gait and ROM intact.  Skin: Warm and dry. Color is normal.  Neuro:  Strength and sensation are intact and no gross focal deficits noted.  Psych: Alert, appropriate and with normal affect.   LABORATORY DATA:  EKG:  EKG is not ordered today.  Lab Results  Component Value Date   WBC 5.0 07/16/2016   HGB 14.7 07/16/2016   HCT 42.1 07/16/2016   PLT 146.0 (L) 07/16/2016   GLUCOSE 107 (H) 11/08/2016   CHOL 174 07/16/2016   TRIG 312.0 (H) 07/16/2016   HDL 50.60 07/16/2016   LDLDIRECT 77.0 07/16/2016   LDLCALC 69 06/28/2015   ALT 16 07/16/2016   AST 21 07/16/2016   NA 143 11/08/2016   K 3.9 11/08/2016   CL 102 11/08/2016   CREATININE 0.69 11/08/2016   BUN 12 11/08/2016   CO2 25 11/08/2016   TSH 1.05 06/28/2015     BNP (last 3 results) No results for input(s): BNP in the last 8760 hours.  ProBNP (last 3 results) No results for input(s): PROBNP in the last 8760 hours.   Other Studies Reviewed Today:  CT CARDIAC FINDINGS 10/2016: Non-cardiac: See separate report from [facility].  Ascending Aorta:  Normal size.  No calcifications.  Pericardium: Normal.  Coronary arteries:  Normal origin.  IMPRESSION: Coronary calcium score of 0. This was 0 percentile for age and sex matched control.  Ena Dawley   Electronically Signed   By: Ena Dawley   On: 11/11/2016 07:49     Echo Study Conclusions 07/2016  - Left ventricle: The cavity size was normal. Wall thickness  was   normal. Systolic function was normal. The estimated ejection   fraction was in the range of 60% to 65%. Wall motion was normal;   there were no regional wall motion abnormalities. Doppler   parameters are consistent with abnormal left ventricular   relaxation (grade 1 diastolic dysfunction). - Mitral valve: There was mild regurgitation. - Tricuspid valve: There was trivial regurgitation. - Pulmonary arteries: Systolic pressure was mildly increased. PA   peak pressure: 38 mm Hg (S).  Assessment/Plan:  1. HTN - uncontrolled - already on 3 agents - will  add Norvasc 5 mg to take at night - most of her elevated readings are in the morning. Needs her BP cuff checked - she will bring at next visit - may need to consider renal duplex if we cannot get her BP controlled.   2. Prior chest pain - was not able to have stress echo due to elevated BP with holding her beta blocker - no further symptoms noted. Would follow for now.   3. Diastolic dysfunction - understands need for good BP control and continued salt restriction.   4. HLD - on statin therapy - calcium scoring 0.   Current medicines are reviewed with the patient today.  The patient does not have concerns regarding medicines other than what has been noted above.  The following changes have been made:  See above.  Labs/ tests ordered today include:   No orders of the defined types were placed in this encounter.    Disposition:   FU with me in about 2 weeks. She will be seen next week in the hypertension clinic for follow up.    Patient is agreeable to this plan and will call if any problems develop in the interim.   SignedTruitt Merle, NP  12/09/2016 10:19 AM  [facility] [address] [place], [place]  [address] Phone: [phone] Fax: [phone]

## 2016-12-16 ENCOUNTER — Ambulatory Visit: Payer: Medicare Other

## 2016-12-16 NOTE — Progress Notes (Signed)
Patient ID: KEILEN DYKE                 DOB: [birth year]                      MRN: [id number]     HPI: Tiffany Graham is a 66 y.o. female referred by Dr. Eden Emms to HTN clinic. PMH includes hyperlipidemia, uncontrolled hypertension, and diastolic dysfunction. Metoprolol succinate 50mg  daily and losartan/HCTZ 100/12.5mg  daily were started by Dr Eden Emms on 10/14/2016. Repeat BMET after ARB initation shows stable renal function and electrolytes. Amlodipine 5mg  daily was added to therapy during last OV with Tiffany Patten NP on 11/18/2016.  Patient reports feeling a little bit more tired but no major changes o ADR noted. Denies dizziness, headaches, swelling or chest pain.   Current HTN meds:  Metoprolol succinate 50mg  daily (evening) Losartan/HCTZ 100/12.5mg  daily (morning) Amlodipine 5mg  daily (evening)  BP goal: 130/80  Family History: family history includes Afib, CAD ad colon cancer from father; MI from mother; Afib and skin cancer from brother  Social History: former smoker (quit Jan/1993); occasional wine; denies any other drug or tobacco product  Diet: avoid adding salt to cooked food; no other limitations or DASH diet  Exercise: gym 2-3x/week  Home BP readings: 16 readings; average 138/66 (pulse 58-78bpm)  Omron Intelli-sense home arm cuff (68 years old); unfortunately her home meter is readying >57mmHg different from manual readying.  Patient plan to change her device ASAP.  Wt Readings from Last 3 Encounters:  12/09/16 154 lb (69.9 kg)  11/18/16 153 lb 3.2 oz (69.5 kg)  10/14/16 151 lb 6.4 oz (68.7 kg)   BP Readings from Last 3 Encounters:  12/17/16 132/70  12/09/16 (!) 150/100  11/18/16 136/78   Pulse Readings from Last 3 Encounters:  12/17/16 78  12/09/16 62  11/18/16 62    Past Medical History:  Diagnosis Date  . Hyperlipidemia   . Hypertension   . Osteoarthritis of left patellofemoral joint 05/29/2016    Current Outpatient Medications on File Prior to Visit    Medication Sig Dispense Refill  . amLODipine (NORVASC) 5 MG tablet Take 1 tablet (5 mg total) daily by mouth. 90 tablet 3  . aspirin 81 MG tablet Take 81 mg by mouth daily.    . calcium carbonate (OS-CAL) 600 MG TABS tablet Take 600 mg by mouth 2 (two) times daily with a meal.    . Cholecalciferol (VITAMIN D3) 1000 UNITS CAPS Take by mouth.    . Coenzyme Q10 (CO Q-10) 200 MG CAPS Take 1 tablet by mouth daily.    . Glucosamine HCl (GLUCOSAMINE PO) Take 1 tablet by mouth daily.    Marland Kitchen losartan-hydrochlorothiazide (HYZAAR) 100-12.5 MG tablet Take 1 tablet by mouth daily. 90 tablet 3  . metoprolol succinate (TOPROL-XL) 50 MG 24 hr tablet Take 1 tablet (50 mg total) by mouth daily. Take with or immediately following a meal. 90 tablet 3  . Omega-3 Fatty Acids (FISH OIL) 1000 MG CAPS Take 1 capsule by mouth daily.    . simvastatin (ZOCOR) 20 MG tablet Take 1 tablet (20 mg total) by mouth at bedtime. 90 tablet 3   No current facility-administered medications on file prior to visit.     Allergies  Allergen Reactions  . Triprolidine-Pseudoephedrine Hives    Ingredient in Actifed    Blood pressure 132/70, pulse 78, SpO2 97 %.  Essential hypertension Blood pressure much improved today after adding amlodipine to therapy 10  days ago. Patient denies adverse effect to current medication and feeling better in general. Her home BP monitor is NOT accurate and she plans to get a new monitor ASAP. Will continue all medication as previously prescribed, continue to monitor BP twice daily , bring records to f/u appointment with PA in 8 weeks( pt out of town in December), and follow up with HTN clinic as needed.   [name] PharmD, BCPS, CPP [facility] [address] [place] [address] 12/18/2016 4:54 PM

## 2016-12-17 ENCOUNTER — Ambulatory Visit (INDEPENDENT_AMBULATORY_CARE_PROVIDER_SITE_OTHER): Payer: Medicare Other | Admitting: Pharmacist

## 2016-12-17 VITALS — BP 132/70 | HR 78

## 2016-12-17 DIAGNOSIS — I10 Essential (primary) hypertension: Secondary | ICD-10-CM | POA: Diagnosis not present

## 2016-12-17 NOTE — Patient Instructions (Addendum)
Return for a follow up appointment as needed  Your blood pressure today is 132/70 pulse 78  Check your blood pressure at home daily (if able) and keep record of the readings.  Take your BP meds as follows: *All medication as previously prescribed*  Bring all of your meds, your BP cuff and your record of home blood pressures to your next appointment.  Exercise as you're able, try to walk approximately 30 minutes per day.  Keep salt intake to a minimum, especially watch canned and prepared boxed foods.  Eat more fresh fruits and vegetables and fewer canned items.  Avoid eating in fast food restaurants.    HOW TO TAKE YOUR BLOOD PRESSURE: . Rest 5 minutes before taking your blood pressure. .  Don't smoke or drink caffeinated beverages for at least 30 minutes before. . Take your blood pressure before (not after) you eat. . Sit comfortably with your back supported and both feet on the floor (don't cross your legs). . Elevate your arm to heart level on a table or a desk. . Use the proper sized cuff. It should fit smoothly and snugly around your bare upper arm. There should be enough room to slip a fingertip under the cuff. The bottom edge of the cuff should be 1 inch above the crease of the elbow. . Ideally, take 3 measurements at one sitting and record the average.

## 2016-12-18 ENCOUNTER — Encounter: Payer: Self-pay | Admitting: Pharmacist

## 2016-12-18 NOTE — Assessment & Plan Note (Signed)
Blood pressure much improved today after adding amlodipine to therapy 10 days ago. Patient denies adverse effect to current medication and feeling better in general. Her home BP monitor is NOT accurate and she plans to get a new monitor ASAP. Will continue all medication as previously prescribed, continue to monitor BP twice daily , bring records to f/u appointment with PA in 8 weeks( pt out of town in December), and follow up with HTN clinic as needed.

## 2017-01-27 ENCOUNTER — Encounter: Payer: Self-pay | Admitting: Internal Medicine

## 2017-01-27 ENCOUNTER — Encounter: Payer: Self-pay | Admitting: Family Medicine

## 2017-01-27 DIAGNOSIS — Z1211 Encounter for screening for malignant neoplasm of colon: Secondary | ICD-10-CM

## 2017-01-27 DIAGNOSIS — Z1231 Encounter for screening mammogram for malignant neoplasm of breast: Secondary | ICD-10-CM

## 2017-01-27 DIAGNOSIS — M810 Age-related osteoporosis without current pathological fracture: Secondary | ICD-10-CM

## 2017-02-11 ENCOUNTER — Ambulatory Visit: Payer: Medicare Other | Admitting: Nurse Practitioner

## 2017-02-18 ENCOUNTER — Ambulatory Visit (INDEPENDENT_AMBULATORY_CARE_PROVIDER_SITE_OTHER): Payer: Medicare Other | Admitting: Nurse Practitioner

## 2017-02-18 ENCOUNTER — Encounter: Payer: Self-pay | Admitting: Nurse Practitioner

## 2017-02-18 ENCOUNTER — Telehealth: Payer: Self-pay | Admitting: *Deleted

## 2017-02-18 VITALS — BP 138/80 | HR 62 | Ht 64.5 in | Wt 151.8 lb

## 2017-02-18 DIAGNOSIS — I10 Essential (primary) hypertension: Secondary | ICD-10-CM

## 2017-02-18 DIAGNOSIS — H9190 Unspecified hearing loss, unspecified ear: Secondary | ICD-10-CM

## 2017-02-18 DIAGNOSIS — E7849 Other hyperlipidemia: Secondary | ICD-10-CM | POA: Diagnosis not present

## 2017-02-18 NOTE — Progress Notes (Signed)
CARDIOLOGY OFFICE NOTE  Date:  02/18/2017    Tiffany Graham Date of Birth: 23-Oct-1950 Medical Record #[id number]  PCP:  Ashtin Held, DO  Cardiologist:  Gillian Shields    Chief Complaint  Patient presents with  . Hypertension  . Hyperlipidemia    2 month check - seen for Dr. Johnsie Cancel    History of Present Illness: Tiffany Graham is a 67 y.o. female who presents today for a 2 month check. Seen for Dr. Johnsie Cancel.   She was referred here in 4332 for diastolic dysfunction and murmur. She has HTN and HLD. Remote smoker but quit over 30 years ago. Echo from 7/18 showed normal EF and grade 1 DD.   Seen here in September - BP poorly controlled. Was unable to have a stress echo due to holding beta blocker and BP was over 951 systolic. I then saw her in November - added Norvasc for BP control and on follow up in the HTN clinic - she was doing ok.     Comes in today. Here alone. She is doing great. No chest pain. Not short of breath. BP list from home is fantastic. She has a new cuff - matches up pretty well. Was a little dizzy initially with starting Norvasc but this has improved. She is back on Weight Watcher's. She is playing pickle ball. She is planning on moving to [place] in August. She is quite happy with how she is doing and has no real concerns. She is taking her Hyzaar and Norvasc at night and her beta blocker in the AM.   Past Medical History:  Diagnosis Date  . Hyperlipidemia   . Hypertension   . 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 APPENDECTOMY  1991  . CHOLECYSTECTOMY  2004  . TOTAL ABDOMINAL HYSTERECTOMY  1991   with BSO, secondary fibroids  . TUBAL LIGATION  1987   first one 1974, then reversed 1983     Medications: Current Meds  Medication Sig  . amLODipine (NORVASC) 5 MG tablet Take 1 tablet (5 mg total) daily by mouth.  Marland Kitchen aspirin 81 MG tablet Take 81 mg by mouth daily.  .  calcium carbonate (OS-CAL) 600 MG TABS tablet Take 600 mg by mouth 2 (two) times daily with a meal.  . Cholecalciferol (VITAMIN D3) 1000 UNITS CAPS Take by mouth.  . Coenzyme Q10 (CO Q-10) 200 MG CAPS Take 1 tablet by mouth daily.  . Glucosamine HCl (GLUCOSAMINE PO) Take 1 tablet by mouth daily.  Marland Kitchen losartan-hydrochlorothiazide (HYZAAR) 100-12.5 MG tablet Take 1 tablet by mouth daily.  . metoprolol succinate (TOPROL-XL) 50 MG 24 hr tablet Take 1 tablet (50 mg total) by mouth daily. Take with or immediately following a meal.  . Omega-3 Fatty Acids (FISH OIL) 1000 MG CAPS Take 1 capsule by mouth daily.  . simvastatin (ZOCOR) 20 MG tablet Take 1 tablet (20 mg total) by mouth at bedtime.     Allergies: Allergies  Allergen Reactions  . Triprolidine-Pseudoephedrine Hives    Ingredient in Actifed    Social History: The patient  reports that she quit smoking about 36 years ago. she has never used smokeless tobacco. She reports that she drinks about 2.4 oz of alcohol per week. She reports that she does not use drugs.   Family History: The patient's family history includes Asthma in her unknown relative; Atrial fibrillation in her brother, father,  and other; CAD in her father; Clotting disorder in her unknown relative; Colon cancer in her unknown relative; Colon cancer (age of onset: 45) in her father; Coronary artery disease in her unknown relative; Heart attack (age of onset: 21) in her mother; Hyperlipidemia in her unknown relative; Hypertension in her unknown relative; Osteoporosis in her unknown relative; Skin cancer in her brother; Thyroid disease in her unknown relative.   Review of Systems: Please see the history of present illness.   Otherwise, the review of systems is positive for none.   All other systems are reviewed and negative.   Physical Exam: VS:  BP 138/80 (BP Location: Left Arm, Patient Position: Sitting, Cuff Size: Normal)   Pulse 62   Ht 5' 4.5" (1.638 m)   Wt 151 lb 12.8 oz  (68.9 kg)   SpO2 100% Comment: at rest  BMI 25.65 kg/m  .  BMI Body mass index is 25.65 kg/m.  Wt Readings from Last 3 Encounters:  02/18/17 151 lb 12.8 oz (68.9 kg)  12/09/16 154 lb (69.9 kg)  11/18/16 153 lb 3.2 oz (69.5 kg)    General: Pleasant. Well developed, well nourished and in no acute distress.   HEENT: Normal.  Neck: Supple, no JVD, carotid bruits, or masses noted.  Cardiac: Regular rate and rhythm. No murmurs, rubs, or gallops. No edema.  Respiratory:  Lungs are clear to auscultation bilaterally with normal work of breathing.  GI: Soft and nontender.  MS: No deformity or atrophy. Gait and ROM intact.  Skin: Warm and dry. Color is normal.  Neuro:  Strength and sensation are intact and no gross focal deficits noted.  Psych: Alert, appropriate and with normal affect.   LABORATORY DATA:  EKG:  EKG is not ordered today.  Lab Results  Component Value Date   WBC 5.0 07/16/2016   HGB 14.7 07/16/2016   HCT 42.1 07/16/2016   PLT 146.0 (L) 07/16/2016   GLUCOSE 107 (H) 11/08/2016   CHOL 174 07/16/2016   TRIG 312.0 (H) 07/16/2016   HDL 50.60 07/16/2016   LDLDIRECT 77.0 07/16/2016   LDLCALC 69 06/28/2015   ALT 16 07/16/2016   AST 21 07/16/2016   NA 143 11/08/2016   K 3.9 11/08/2016   CL 102 11/08/2016   CREATININE 0.69 11/08/2016   BUN 12 11/08/2016   CO2 25 11/08/2016   TSH 1.05 06/28/2015     BNP (last 3 results) No results for input(s): BNP in the last 8760 hours.  ProBNP (last 3 results) No results for input(s): PROBNP in the last 8760 hours.   Other Studies Reviewed Today:  CT CARDIAC FINDINGS 10/2016: Non-cardiac: See separate report from [facility].  Ascending Aorta: Normal size. No calcifications.  Pericardium: Normal.  Coronary arteries: Normal origin.  IMPRESSION: Coronary calcium score of 0. This was 0 percentile for age and sex matched control.  Ena Dawley   Electronically Signed By: Ena Dawley On: 11/11/2016 07:49     Echo Study Conclusions 07/2016  - Left ventricle: The cavity size was normal. Wall thickness was normal. Systolic function was normal. The estimated ejection fraction was in the range of 60% to 65%. Wall motion was normal; there were no regional wall motion abnormalities. Doppler parameters are consistent with abnormal left ventricular relaxation (grade 1 diastolic dysfunction). - Mitral valve: There was mild regurgitation. - Tricuspid valve: There was trivial regurgitation. - Pulmonary arteries: Systolic pressure was mildly increased. PA peak pressure: 38 mm Hg (S).  Assessment/Plan:  1. HTN -  BP is great on her current regimen. No changes made today. Will see back prior to her move to [place]. She will be working on establishing care there.   2. Prior chest pain - was not able to have stress echo due to elevated BP with holding her beta blocker - no further symptoms noted. She is quite active and has no symptoms. Would follow for now.   3. Diastolic dysfunction - understands need for good BP control and continued salt restriction. BP currently at goal.   4. HLD - on statin therapy - calcium scoring 0.    Current medicines are reviewed with the patient today.  The patient does not have concerns regarding medicines other than what has been noted above.  The following changes have been made:  See above.  Labs/ tests ordered today include:   No orders of the defined types were placed in this encounter.    Disposition:   FU with me prior to moving to [place].   Patient is agreeable to this plan and will call if any problems develop in the interim.   SignedTruitt Merle, NP  02/18/2017 10:04 AM  [facility] [address] [place], [place]  [address] Phone: [phone] Fax: [phone]

## 2017-02-18 NOTE — Patient Instructions (Addendum)
We will be checking the following labs today - NONE   Medication Instructions:    Continue with your current medicines.     Testing/Procedures To Be Arranged:  N/A  Follow-Up:   See me in July with fasting labs    Other Special Instructions:   Continue to monitor your BP for [place].     If you need a refill on your cardiac medications before your next appointment, please call your pharmacy.   Call the [place] office at [phone] if you have any questions, problems or concerns.

## 2017-02-18 NOTE — Telephone Encounter (Signed)
Copied from [place]. Topic: Referral - Request >> Feb 17, 2017 11:19 AM Carolyn Stare wrote:  Pt call to ask for a referral to see [facility] [address] [address]   phone  number   [phone]   because she is having some hearing  issues

## 2017-02-19 NOTE — Telephone Encounter (Signed)
Ok to refer.

## 2017-02-19 NOTE — Telephone Encounter (Signed)
Do you want to see her for this or go ahead with referral

## 2017-02-19 NOTE — Telephone Encounter (Signed)
Patient would like to be referred to this provider because her daughter has seen him before.  Referral placed

## 2017-02-21 ENCOUNTER — Encounter: Payer: Self-pay | Admitting: *Deleted

## 2017-03-10 ENCOUNTER — Encounter: Payer: Self-pay | Admitting: Family Medicine

## 2017-03-10 ENCOUNTER — Ambulatory Visit (INDEPENDENT_AMBULATORY_CARE_PROVIDER_SITE_OTHER): Payer: Medicare Other | Admitting: Family Medicine

## 2017-03-10 VITALS — BP 122/68 | HR 62 | Temp 98.5°F | Resp 16 | Ht 65.0 in | Wt 148.2 lb

## 2017-03-10 DIAGNOSIS — E785 Hyperlipidemia, unspecified: Secondary | ICD-10-CM | POA: Diagnosis not present

## 2017-03-10 DIAGNOSIS — M545 Low back pain, unspecified: Secondary | ICD-10-CM

## 2017-03-10 DIAGNOSIS — I10 Essential (primary) hypertension: Secondary | ICD-10-CM

## 2017-03-10 LAB — POC URINALSYSI DIPSTICK (AUTOMATED)
Bilirubin, UA: NEGATIVE
Blood, UA: NEGATIVE
Glucose, UA: NEGATIVE
Ketones, UA: NEGATIVE
LEUKOCYTES UA: NEGATIVE
Nitrite, UA: NEGATIVE
PH UA: 6 (ref 5.0–8.0)
PROTEIN UA: NEGATIVE
SPEC GRAV UA: 1.025 (ref 1.010–1.025)
UROBILINOGEN UA: 0.2 U/dL

## 2017-03-10 LAB — COMPREHENSIVE METABOLIC PANEL
ALK PHOS: 50 U/L (ref 39–117)
ALT: 14 U/L (ref 0–35)
AST: 17 U/L (ref 0–37)
Albumin: 4.3 g/dL (ref 3.5–5.2)
BILIRUBIN TOTAL: 0.7 mg/dL (ref 0.2–1.2)
BUN: 20 mg/dL (ref 6–23)
CO2: 30 mEq/L (ref 19–32)
CREATININE: 0.88 mg/dL (ref 0.40–1.20)
Calcium: 9.5 mg/dL (ref 8.4–10.5)
Chloride: 103 mEq/L (ref 96–112)
GFR: 67.76 mL/min (ref >60.00)
GLUCOSE: 90 mg/dL (ref 70–99)
Potassium: 3.9 mEq/L (ref 3.5–5.1)
SODIUM: 142 meq/L (ref 135–145)
TOTAL PROTEIN: 7.1 g/dL (ref 6.0–8.3)

## 2017-03-10 LAB — LIPID PANEL
CHOLESTEROL: 123 mg/dL (ref 0–200)
HDL: 47.6 mg/dL (ref >39.00)
LDL Cholesterol: 47 mg/dL (ref 0–99)
NonHDL: 75.65
Total CHOL/HDL Ratio: 3
Triglycerides: 141 mg/dL (ref 0.0–149.0)
VLDL: 28.2 mg/dL (ref 0.0–40.0)

## 2017-03-10 MED ORDER — TIZANIDINE HCL 2 MG PO TABS: 4.0000 mg | ORAL_TABLET | Freq: Three times a day (TID) | ORAL | Status: DC | PRN

## 2017-03-10 NOTE — Patient Instructions (Signed)

## 2017-03-10 NOTE — Assessment & Plan Note (Signed)
Well controlled, no changes to meds. Encouraged heart healthy diet such as the DASH diet and exercise as tolerated.  °

## 2017-03-10 NOTE — Progress Notes (Signed)
Patient ID: Tiffany Graham, female   DOB: 1950/03/20, 67 y.o.   MRN: [id number]    Subjective:  I acted as a Education administrator for Dr. Carollee Herter.  Guerry Bruin, [place]   Patient ID: Tiffany Graham, female    DOB: 1950/04/29, 67 y.o.   MRN: [id number]  Chief Complaint  Patient presents with  . Back Pain    HPI  Patient is in today for low back pain on r side .  Pain comes and goes and it has been for about 6 months now.  Has taken ibuprofen or aleve for pain.   Patient Care Team: Carollee Herter, Alferd Apa, DO as PCP - General Marygrace Drought, MD as Consulting Physician (Ophthalmology)   Past Medical History:  Diagnosis Date  . Hyperlipidemia   . Hypertension   . 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 APPENDECTOMY  1991  . CHOLECYSTECTOMY  2004  . TOTAL ABDOMINAL HYSTERECTOMY  1991   with BSO, secondary fibroids  . TUBAL LIGATION  1987   first one 1974, then reversed 1983    Family History  Problem Relation Age of Onset  . Colon cancer Father 69  . CAD Father   . Atrial fibrillation Father   . Colon cancer Unknown   . Coronary artery disease Unknown   . Asthma Unknown   . Hyperlipidemia Unknown   . Hypertension Unknown   . Osteoporosis Unknown   . Thyroid disease Unknown   . Clotting disorder Unknown        thromboembolism  . Heart attack Mother 38  . Atrial fibrillation Brother   . Skin cancer Brother   . Atrial fibrillation Other   . Breast cancer Neg Hx     Social History   Socioeconomic History  . Marital status: Married    Spouse name: Not on file  . Number of children: Not on file  . Years of education: Not on file  . Highest education level: Not on file  Social Needs  . Financial resource strain: Not on file  . Food insecurity - worry: Not on file  . Food insecurity - inability: Not on file  . Transportation needs - medical: Not on file  . Transportation needs - non-medical: Not on file    Occupational History  . Not on file  Tobacco Use  . Smoking status: Former Smoker    Last attempt to quit: 01/21/1981    Years since quitting: 36.1  . Smokeless tobacco: Never Used  Substance and Sexual Activity  . Alcohol use: Yes    Alcohol/week: 2.4 oz    Types: 4 Glasses of wine per week    Comment: 1 bottle of wine per weekend  . Drug use: No  . Sexual activity: Yes  Other Topics Concern  . Not on file  Social History Narrative  . Not on file    Outpatient Medications Prior to Visit  Medication Sig Dispense Refill  . amLODipine (NORVASC) 5 MG tablet Take 1 tablet (5 mg total) daily by mouth. 90 tablet 3  . aspirin 81 MG tablet Take 81 mg by mouth daily.    . calcium carbonate (OS-CAL) 600 MG TABS tablet Take 600 mg by mouth 2 (two) times daily with a meal.    . Glucosamine HCl (GLUCOSAMINE PO) Take 1 tablet by mouth daily.    Marland Kitchen losartan-hydrochlorothiazide (HYZAAR) 100-12.5 MG tablet Take 1 tablet by mouth daily. 90 tablet 3  .  metoprolol succinate (TOPROL-XL) 50 MG 24 hr tablet Take 1 tablet (50 mg total) by mouth daily. Take with or immediately following a meal. 90 tablet 3  . Omega-3 Fatty Acids (FISH OIL) 1000 MG CAPS Take 1 capsule by mouth daily.    . simvastatin (ZOCOR) 20 MG tablet Take 1 tablet (20 mg total) by mouth at bedtime. 90 tablet 3  . Cholecalciferol (VITAMIN D3) 1000 UNITS CAPS Take by mouth.    . Coenzyme Q10 (CO Q-10) 200 MG CAPS Take 1 tablet by mouth daily.     No facility-administered medications prior to visit.     Allergies  Allergen Reactions  . Triprolidine-Pseudoephedrine Hives    Ingredient in Actifed    Review of Systems  Constitutional: Negative for fever and malaise/fatigue.  HENT: Negative for congestion.   Eyes: Negative for blurred vision.  Respiratory: Negative for cough and shortness of breath.   Cardiovascular: Negative for chest pain, palpitations and leg swelling.  Gastrointestinal: Negative for vomiting.  Musculoskeletal:  Positive for back pain (low back pain).  Skin: Negative for rash.  Neurological: Negative for loss of consciousness and headaches.       Objective:    Physical Exam  Constitutional: She is oriented to person, place, and time. She appears well-developed and well-nourished.  HENT:  Head: Normocephalic and atraumatic.  Eyes: Conjunctivae and EOM are normal.  Neck: Normal range of motion. Neck supple. No JVD present. Carotid bruit is not present. No thyromegaly present.  Cardiovascular: Normal rate, regular rhythm and normal heart sounds.  No murmur heard. Pulmonary/Chest: Effort normal and breath sounds normal. No respiratory distress. She has no wheezes. She has no rales. She exhibits no tenderness.  Musculoskeletal: She exhibits no edema, tenderness or deformity.  Neurological: She is alert and oriented to person, place, and time. She displays normal reflexes. No cranial nerve deficit. She exhibits normal muscle tone. Coordination normal.  Psychiatric: She has a normal mood and affect.  Nursing note and vitals reviewed.   BP 122/68 (BP Location: Right Arm, Cuff Size: Normal)   Pulse 62   Temp 98.5 F (36.9 C) (Oral)   Resp 16   Ht 5' 5"  (1.651 m)   Wt 148 lb 3.2 oz (67.2 kg)   SpO2 97%   BMI 24.66 kg/m  Wt Readings from Last 3 Encounters:  03/10/17 148 lb 3.2 oz (67.2 kg)  02/18/17 151 lb 12.8 oz (68.9 kg)  12/09/16 154 lb (69.9 kg)   BP Readings from Last 3 Encounters:  03/10/17 122/68  02/18/17 138/80  12/17/16 132/70     Immunization History  Administered Date(s) Administered  . Hepatitis B 02/28/1999, 03/28/1999, 08/28/1999  . Influenza Split 11/20/2016  . Influenza Whole 11/02/2008, 10/22/2011  . Influenza,inj,Quad PF,6+ Mos 10/05/2013  . Influenza-Unspecified 10/06/2012, 10/31/2014, 11/13/2015  . Pneumococcal Conjugate-13 04/03/2014  . Pneumococcal Polysaccharide-23 02/10/2013  . Td 01/21/2006  . Tdap 01/03/2006, 12/24/2016  . Zoster 03/16/2009    Health  Maintenance  Topic Date Due  . COLONOSCOPY  03/16/2017  . MAMMOGRAM  03/12/2018  . TETANUS/TDAP  12/25/2026  . INFLUENZA VACCINE  Completed  . DEXA SCAN  Completed  . Hepatitis C Screening  Completed  . PNA vac Low Risk Adult  Addressed    Lab Results  Component Value Date   WBC 5.0 07/16/2016   HGB 14.7 07/16/2016   HCT 42.1 07/16/2016   PLT 146.0 (L) 07/16/2016   GLUCOSE 107 (H) 11/08/2016   CHOL 174 07/16/2016   TRIG  312.0 (H) 07/16/2016   HDL 50.60 07/16/2016   LDLDIRECT 77.0 07/16/2016   LDLCALC 69 06/28/2015   ALT 16 07/16/2016   AST 21 07/16/2016   NA 143 11/08/2016   K 3.9 11/08/2016   CL 102 11/08/2016   CREATININE 0.69 11/08/2016   BUN 12 11/08/2016   CO2 25 11/08/2016   TSH 1.05 06/28/2015    Lab Results  Component Value Date   TSH 1.05 06/28/2015   Lab Results  Component Value Date   WBC 5.0 07/16/2016   HGB 14.7 07/16/2016   HCT 42.1 07/16/2016   MCV 91.0 07/16/2016   PLT 146.0 (L) 07/16/2016   Lab Results  Component Value Date   NA 143 11/08/2016   K 3.9 11/08/2016   CO2 25 11/08/2016   GLUCOSE 107 (H) 11/08/2016   BUN 12 11/08/2016   CREATININE 0.69 11/08/2016   BILITOT 0.6 07/16/2016   ALKPHOS 53 07/16/2016   AST 21 07/16/2016   ALT 16 07/16/2016   PROT 6.9 07/16/2016   ALBUMIN 4.5 07/16/2016   CALCIUM 9.6 11/08/2016   GFR 89.89 07/16/2016   Lab Results  Component Value Date   CHOL 174 07/16/2016   Lab Results  Component Value Date   HDL 50.60 07/16/2016   Lab Results  Component Value Date   LDLCALC 69 06/28/2015   Lab Results  Component Value Date   TRIG 312.0 (H) 07/16/2016   Lab Results  Component Value Date   CHOLHDL 3 07/16/2016   No results found for: HGBA1C       Assessment & Plan:   Problem List Items Addressed This Visit      Unprioritized   Essential hypertension    Well controlled, no changes to meds. Encouraged heart healthy diet such as the DASH diet and exercise as tolerated.       Relevant  Orders   Lipid panel   Comprehensive metabolic panel   Hyperlipidemia LDL goal <100    Tolerating statin, encouraged heart healthy diet, avoid trans fats, minimize simple carbs and saturated fats. Increase exercise as tolerated      Relevant Orders   Lipid panel   Comprehensive metabolic panel    Other Visit Diagnoses    Low back pain without sciatica, unspecified back pain laterality, unspecified chronicity    -  Primary   Relevant Medications   tiZANidine (ZANAFLEX) tablet 4 mg   Other Relevant Orders   POCT Urinalysis Dipstick (Automated) (Completed)      I have discontinued Franne Grip. Lyn's Vitamin D3 and Co Q-10. I am also having her maintain her aspirin, Fish Oil, calcium carbonate, simvastatin, Glucosamine HCl (GLUCOSAMINE PO), metoprolol succinate, losartan-hydrochlorothiazide, and amLODipine. We will continue to administer tiZANidine.  Meds ordered this encounter  Medications  . tiZANidine (ZANAFLEX) tablet 4 mg    CMA served as scribe during this visit. History, Physical and Plan performed by medical provider. Documentation and orders reviewed and attested to.  Carmelle Held, DO

## 2017-03-10 NOTE — Assessment & Plan Note (Signed)
Tolerating statin, encouraged heart healthy diet, avoid trans fats, minimize simple carbs and saturated fats. Increase exercise as tolerated 

## 2017-03-11 ENCOUNTER — Ambulatory Visit (AMBULATORY_SURGERY_CENTER): Payer: Self-pay

## 2017-03-11 ENCOUNTER — Other Ambulatory Visit: Payer: Self-pay | Admitting: *Deleted

## 2017-03-11 ENCOUNTER — Other Ambulatory Visit: Payer: Self-pay

## 2017-03-11 ENCOUNTER — Encounter: Payer: Self-pay | Admitting: *Deleted

## 2017-03-11 VITALS — Ht 65.0 in | Wt 148.8 lb

## 2017-03-11 DIAGNOSIS — E785 Hyperlipidemia, unspecified: Secondary | ICD-10-CM

## 2017-03-11 DIAGNOSIS — I10 Essential (primary) hypertension: Secondary | ICD-10-CM

## 2017-03-11 DIAGNOSIS — Z800 Family history of malignant neoplasm of digestive organs: Secondary | ICD-10-CM

## 2017-03-11 MED ORDER — PEG 3350-KCL-NA BICARB-NACL 420 G PO SOLR: 4000.0000 mL | Freq: Once | ORAL | 0 refills | Status: AC

## 2017-03-11 NOTE — Progress Notes (Signed)
No egg or soy allergy known to patient  No issues with past sedation with any surgeries  or procedures, no intubation problems  No diet pills per patient No home 02 use per patient  No blood thinners per patient  Pt denies issues with constipation  No A fib or A flutter  EMMI video sent to pt's e mail pt declined

## 2017-03-17 ENCOUNTER — Ambulatory Visit
Admission: RE | Admit: 2017-03-17 | Discharge: 2017-03-17 | Disposition: A | Discharge status: Home / Self Care | Payer: Medicare Other | Source: Ambulatory Visit | Attending: Family Medicine | Admitting: Family Medicine

## 2017-03-17 DIAGNOSIS — M85851 Other specified disorders of bone density and structure, right thigh: Secondary | ICD-10-CM | POA: Diagnosis not present

## 2017-03-17 DIAGNOSIS — Z1231 Encounter for screening mammogram for malignant neoplasm of breast: Secondary | ICD-10-CM | POA: Diagnosis not present

## 2017-03-17 DIAGNOSIS — Z780 Asymptomatic menopausal state: Secondary | ICD-10-CM | POA: Diagnosis not present

## 2017-03-25 ENCOUNTER — Ambulatory Visit (AMBULATORY_SURGERY_CENTER): Payer: Medicare Other | Admitting: Internal Medicine

## 2017-03-25 ENCOUNTER — Other Ambulatory Visit: Payer: Self-pay

## 2017-03-25 ENCOUNTER — Encounter: Payer: Medicare Other | Admitting: Internal Medicine

## 2017-03-25 ENCOUNTER — Encounter: Payer: Self-pay | Admitting: Internal Medicine

## 2017-03-25 VITALS — BP 107/39 | HR 58 | Temp 98.9°F | Resp 17 | Ht 65.0 in | Wt 148.0 lb

## 2017-03-25 DIAGNOSIS — Z800 Family history of malignant neoplasm of digestive organs: Secondary | ICD-10-CM | POA: Diagnosis not present

## 2017-03-25 DIAGNOSIS — K50119 Crohn's disease of large intestine with unspecified complications: Secondary | ICD-10-CM | POA: Diagnosis not present

## 2017-03-25 DIAGNOSIS — K559 Vascular disorder of intestine, unspecified: Secondary | ICD-10-CM | POA: Diagnosis not present

## 2017-03-25 DIAGNOSIS — Z1211 Encounter for screening for malignant neoplasm of colon: Secondary | ICD-10-CM

## 2017-03-25 DIAGNOSIS — K5730 Diverticulosis of large intestine without perforation or abscess without bleeding: Secondary | ICD-10-CM

## 2017-03-25 DIAGNOSIS — K529 Noninfective gastroenteritis and colitis, unspecified: Secondary | ICD-10-CM | POA: Diagnosis not present

## 2017-03-25 DIAGNOSIS — I10 Essential (primary) hypertension: Secondary | ICD-10-CM | POA: Diagnosis not present

## 2017-03-25 MED ORDER — SODIUM CHLORIDE 0.9 % IV SOLN: 500.0000 mL | Freq: Once | INTRAVENOUS | Status: DC

## 2017-03-25 NOTE — Op Note (Signed)
[facility] Patient Name: Tiffany Graham Procedure Date: 03/25/2017 10:42 AM MRN: [id number] Endoscopist: Jerene Bears , MD Age: 67 Referring MD:  Date of Birth: [birth year] Gender: Female Account #: [id number] Procedure:                Colonoscopy Indications:              Screening in patient at increased risk: Family                            history of 1st-degree relative with colorectal                            cancer, Last colonoscopy 5 years ago Medicines:                Monitored Anesthesia Care Procedure:                Pre-Anesthesia Assessment:                           - Prior to the procedure, a History and Physical                            was performed, and patient medications and                            allergies were reviewed. The patient's tolerance of                            previous anesthesia was also reviewed. The risks                            and benefits of the procedure and the sedation                            options and risks were discussed with the patient.                            All questions were answered, and informed consent                            was obtained. Prior Anticoagulants: The patient has                            taken no previous anticoagulant or antiplatelet                            agents. ASA Grade Assessment: II - A patient with                            mild systemic disease. After reviewing the risks                            and benefits, the patient was deemed in  satisfactory condition to undergo the procedure.                           After obtaining informed consent, the colonoscope                            was passed under direct vision. Throughout the                            procedure, the patient's blood pressure, pulse, and                            oxygen saturations were monitored continuously. The                            Colonoscope was introduced through  the anus and                            advanced to the the terminal ileum. The colonoscopy                            was performed without difficulty. The patient                            tolerated the procedure well. The quality of the                            bowel preparation was good. The terminal ileum,                            ileocecal valve, appendiceal orifice, and rectum                            were photographed. Scope In: 10:56:25 AM Scope Out: 11:12:43 AM Scope Withdrawal Time: 0 hours 11 minutes 17 seconds  Total Procedure Duration: 0 hours 16 minutes 18 seconds  Findings:                 The digital rectal exam was normal.                           The terminal ileum appeared normal.                           Segmental moderate inflammation characterized by                            altered vascularity, erosions, erythema and                            ulcerations was found in the proximal sigmoid                            colon, in the mid sigmoid colon and in the distal  descending colon. The colitis covers approximately                            10-15 cm of the left colon. Multiple biopsies were                            obtained with cold forceps for histology in a                            targeted manner.                           Normal mucosa was found in the rectum, in the                            distal sigmoid colon, in the proximal descending                            colon, in the transverse colon, in the ascending                            colon and in the cecum.                           Multiple small-mouthed diverticula were found in                            the sigmoid colon, descending colon and hepatic                            flexure.                           Internal hemorrhoids were found during                            retroflexion. The hemorrhoids were small. Complications:            No  immediate complications. Estimated Blood Loss:     Estimated blood loss was minimal. Impression:               - The examined portion of the ileum was normal.                           - Segmental moderate inflammation was found in the                            proximal sigmoid colon, in the mid sigmoid colon                            and in the distal descending colon, rule out                            inflammatory bowel disease, SCAD, and rule out  ischemic colitis.                           - Normal mucosa in the rectum, in the distal                            sigmoid colon, in the proximal descending colon, in                            the transverse colon, in the ascending colon and in                            the cecum.                           - Moderate diverticulosis in the sigmoid colon, in                            the descending colon and at the hepatic flexure.                           - Internal hemorrhoids.                           - Multiple biopsies were obtained. Recommendation:           - Patient has a contact number available for                            emergencies. The signs and symptoms of potential                            delayed complications were discussed with the                            patient. Return to normal activities tomorrow.                            Written discharge instructions were provided to the                            patient.                           - Resume previous diet.                           - Continue present medications.                           - Avoid NSAIDs given colitis seen today.                           - Await pathology results.                           - Repeat colonoscopy is  recommended. The                            colonoscopy date will be determined after pathology                            results from today's exam become available for                             review. Jerene Bears, MD 03/25/2017 11:21:50 AM This report has been signed electronically.

## 2017-03-25 NOTE — Progress Notes (Signed)
Called to room to assist during endoscopic procedure.  Patient ID and intended procedure confirmed with present staff. Received instructions for my participation in the procedure from the performing physician.  

## 2017-03-25 NOTE — Progress Notes (Signed)
Report to PACU, RN, vss, BBS= Clear.  

## 2017-03-25 NOTE — Progress Notes (Signed)
Pt's states no medical or surgical changes since previsit or office visit. 

## 2017-03-25 NOTE — Patient Instructions (Signed)
**  Handouts given on diverticulosis and hemorrhoids**   YOU HAD AN ENDOSCOPIC PROCEDURE TODAY: Refer to the procedure report and other information in the discharge instructions given to you for any specific questions about what was found during the examination. If this information does not answer your questions, please call [place] office at [phone] to clarify.   YOU SHOULD EXPECT: Some feelings of bloating in the abdomen. Passage of more gas than usual. Walking can help get rid of the air that was put into your GI tract during the procedure and reduce the bloating. If you had a lower endoscopy (such as a colonoscopy or flexible sigmoidoscopy) you may notice spotting of blood in your stool or on the toilet paper. Some abdominal soreness may be present for a day or two, also.  DIET: Your first meal following the procedure should be a light meal and then it is ok to progress to your normal diet. A half-sandwich or bowl of soup is an example of a good first meal. Heavy or fried foods are harder to digest and may make you feel nauseous or bloated. Drink plenty of fluids but you should avoid alcoholic beverages for 24 hours. If you had a esophageal dilation, please see attached instructions for diet.    ACTIVITY: Your care partner should take you home directly after the procedure. You should plan to take it easy, moving slowly for the rest of the day. You can resume normal activity the day after the procedure however YOU SHOULD NOT DRIVE, use power tools, machinery or perform tasks that involve climbing or major physical exertion for 24 hours (because of the sedation medicines used during the test).   SYMPTOMS TO REPORT IMMEDIATELY: A gastroenterologist can be reached at any hour. Please call [phone]  for any of the following symptoms:  Following lower endoscopy (colonoscopy, flexible sigmoidoscopy) Excessive amounts of blood in the stool  Significant tenderness, worsening of abdominal pains   Swelling of the abdomen that is new, acute  Fever of 100 or higher    FOLLOW UP:  If any biopsies were taken you will be contacted by phone or by letter within the next 1-3 weeks. Call [phone]  if you have not heard about the biopsies in 3 weeks.  Please also call with any specific questions about appointments or follow up tests.

## 2017-03-26 ENCOUNTER — Telehealth: Payer: Self-pay | Admitting: *Deleted

## 2017-03-26 NOTE — Telephone Encounter (Signed)
No answer, message left for the patient. 

## 2017-03-26 NOTE — Telephone Encounter (Signed)
Left message on f/u call 

## 2017-03-27 ENCOUNTER — Telehealth: Payer: Self-pay | Admitting: Internal Medicine

## 2017-03-27 NOTE — Telephone Encounter (Signed)
Pt states she saw some blood through her stool this am. Discussed with pt that this was not unusual as she had multiple biopsies done and internal hemorrhoids. Instructed pt to watch for now but to let our office know if this continued. Pt verbalized understanding.

## 2017-03-31 DIAGNOSIS — H9313 Tinnitus, bilateral: Secondary | ICD-10-CM | POA: Diagnosis not present

## 2017-03-31 DIAGNOSIS — H903 Sensorineural hearing loss, bilateral: Secondary | ICD-10-CM | POA: Diagnosis not present

## 2017-03-31 DIAGNOSIS — H6123 Impacted cerumen, bilateral: Secondary | ICD-10-CM | POA: Diagnosis not present

## 2017-06-02 ENCOUNTER — Telehealth: Payer: Self-pay | Admitting: *Deleted

## 2017-06-02 NOTE — Telephone Encounter (Signed)
Received Medical records from [facility]; forwarded to provider/SLS 05/13

## 2017-07-09 ENCOUNTER — Ambulatory Visit: Payer: Medicare Other | Admitting: *Deleted

## 2017-07-16 ENCOUNTER — Telehealth: Payer: Self-pay | Admitting: *Deleted

## 2017-07-16 NOTE — Telephone Encounter (Signed)
Copied from [place] [id number]. Topic: Inquiry >> Jul 15, 2017  2:53 PM Lennox Solders wrote: Reason for CRM: pt is calling and would like shingrix vaccine. Please fax rx to [facility] on [address]. Pt is on medicare

## 2017-07-16 NOTE — Telephone Encounter (Signed)
Advised patient that she does not need rx for shingrix and that she can just go and ask for it at the pharmacy.

## 2017-08-05 ENCOUNTER — Ambulatory Visit (INDEPENDENT_AMBULATORY_CARE_PROVIDER_SITE_OTHER): Payer: Medicare Other | Admitting: Nurse Practitioner

## 2017-08-05 ENCOUNTER — Encounter: Payer: Self-pay | Admitting: Nurse Practitioner

## 2017-08-05 VITALS — BP 170/90 | HR 60 | Ht 64.0 in | Wt 147.8 lb

## 2017-08-05 DIAGNOSIS — E785 Hyperlipidemia, unspecified: Secondary | ICD-10-CM

## 2017-08-05 DIAGNOSIS — I5189 Other ill-defined heart diseases: Secondary | ICD-10-CM | POA: Diagnosis not present

## 2017-08-05 DIAGNOSIS — I10 Essential (primary) hypertension: Secondary | ICD-10-CM | POA: Diagnosis not present

## 2017-08-05 DIAGNOSIS — E7849 Other hyperlipidemia: Secondary | ICD-10-CM | POA: Diagnosis not present

## 2017-08-05 LAB — BASIC METABOLIC PANEL
BUN/Creatinine Ratio: 19 (ref 12–28)
BUN: 15 mg/dL (ref 8–27)
CO2: 25 mmol/L (ref 20–29)
Calcium: 9.6 mg/dL (ref 8.7–10.3)
Chloride: 100 mmol/L (ref 96–106)
Creatinine, Ser: 0.79 mg/dL (ref 0.57–1.00)
GFR calc Af Amer: 88 mL/min/{1.73_m2} (ref >59)
GFR calc non Af Amer: 77 mL/min/{1.73_m2} (ref >59)
Glucose: 91 mg/dL (ref 65–99)
Potassium: 4.2 mmol/L (ref 3.5–5.2)
Sodium: 140 mmol/L (ref 134–144)

## 2017-08-05 LAB — HEPATIC FUNCTION PANEL
ALT: 15 IU/L (ref 0–32)
AST: 22 IU/L (ref 0–40)
Albumin: 4.7 g/dL (ref 3.6–4.8)
Alkaline Phosphatase: 60 IU/L (ref 39–117)
Bilirubin Total: 0.5 mg/dL (ref 0.0–1.2)
Bilirubin, Direct: 0.14 mg/dL (ref 0.00–0.40)
Total Protein: 6.8 g/dL (ref 6.0–8.5)

## 2017-08-05 LAB — LIPID PANEL
Chol/HDL Ratio: 3 ratio (ref 0.0–4.4)
Cholesterol, Total: 162 mg/dL (ref 100–199)
HDL: 54 mg/dL (ref >39)
LDL Calculated: 57 mg/dL (ref 0–99)
Triglycerides: 257 mg/dL — ABNORMAL HIGH (ref 0–149)
VLDL Cholesterol Cal: 51 mg/dL — ABNORMAL HIGH (ref 5–40)

## 2017-08-05 MED ORDER — LOSARTAN POTASSIUM-HCTZ 100-12.5 MG PO TABS: 1.0000 | ORAL_TABLET | Freq: Every day | ORAL | 3 refills | Status: DC

## 2017-08-05 MED ORDER — SIMVASTATIN 20 MG PO TABS: 20.0000 mg | ORAL_TABLET | Freq: Every day | ORAL | 3 refills | Status: DC

## 2017-08-05 MED ORDER — METOPROLOL SUCCINATE ER 50 MG PO TB24: 50.0000 mg | ORAL_TABLET | Freq: Every day | ORAL | 3 refills | Status: DC

## 2017-08-05 MED ORDER — AMLODIPINE BESYLATE 5 MG PO TABS: 5.0000 mg | ORAL_TABLET | Freq: Every day | ORAL | 3 refills | Status: DC

## 2017-08-05 NOTE — Patient Instructions (Addendum)
We will be checking the following labs today - BMET, HPF and lipids   Medication Instructions:    Continue with your current medicines.   I sent in all your refills today    Testing/Procedures To Be Arranged:  N/A  Follow-Up:   See me in 6 months    Other Special Instructions:   Keep up the good work!    If you need a refill on your cardiac medications before your next appointment, please call your pharmacy.   Call the [place] office at [phone] if you have any questions, problems or concerns.

## 2017-08-05 NOTE — Progress Notes (Signed)
CARDIOLOGY OFFICE NOTE  Date:  08/05/2017    Tiffany Graham Date of Birth: 11-13-1950 Medical Record #[id number]  PCP:  Spirit Held, DO  Cardiologist:  Gillian Shields    Chief Complaint  Patient presents with  . Hypertension    6 month check. Seen for Dr. Johnsie Cancel    History of Present Illness: Tiffany Graham is a 67 y.o. female who presents today for a 6 month check. Seen for Dr. Johnsie Cancel.   She was referred here in 1540 for diastolic dysfunction and murmur. She has HTN and HLD. Remote smoker but quit over 30 years ago. Echo from 7/18 showed normal EF and grade 1 DD.   Seen here in September of 2018 - BP poorly controlled. Was unable to have a stress echo due to holding beta blocker and BP was over 086 systolic. I then saw her in November - added Norvasc for BP control and on follow up in the HTN clinic - she was doing ok. Last visit with me in January - she was doing well. She was taking her Hyzaar and Norvasc at night and her beta blocker in the AM. She was planning on moving to [place] in August.   Comes in today. Here alone. Continues to do well. No chest pain. Not short of breath. Some transient swelling in the setting of extra salt. BP readings from home are excellent. She feels good on her current regimen. Needs meds refilled today along with labs. Overall, doing good with no concerns. She is playing pickle ball - and remains active. 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 She is traveling.    Past Medical History:  Diagnosis Date  . Allergy   . Cataract   . Hyperlipidemia   . Hypertension   . 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 APPENDECTOMY  1991  . CHOLECYSTECTOMY  2004  . TOTAL ABDOMINAL HYSTERECTOMY  1991   with BSO, secondary fibroids  . TUBAL LIGATION  1987   first one 1974, then reversed 1983     Medications: Current Meds  Medication Sig  .  amLODipine (NORVASC) 5 MG tablet Take 1 tablet (5 mg total) by mouth daily.  Marland Kitchen aspirin 81 MG tablet Take 81 mg by mouth daily.  . calcium carbonate (OS-CAL) 600 MG TABS tablet Take 600 mg by mouth 2 (two) times daily with a meal.  . Glucosamine HCl (GLUCOSAMINE PO) Take 1 tablet by mouth daily.  Marland Kitchen losartan-hydrochlorothiazide (HYZAAR) 100-12.5 MG tablet Take 1 tablet by mouth daily.  . metoprolol succinate (TOPROL-XL) 50 MG 24 hr tablet Take 1 tablet (50 mg total) by mouth daily. Take with or immediately following a meal.  . Omega-3 Fatty Acids (FISH OIL) 1000 MG CAPS Take 1 capsule by mouth daily.  . simvastatin (ZOCOR) 20 MG tablet Take 1 tablet (20 mg total) by mouth at bedtime.  . [DISCONTINUED] amLODipine (NORVASC) 5 MG tablet Take 1 tablet (5 mg total) daily by mouth.  . [DISCONTINUED] losartan-hydrochlorothiazide (HYZAAR) 100-12.5 MG tablet Take 1 tablet by mouth daily.  . [DISCONTINUED] metoprolol succinate (TOPROL-XL) 50 MG 24 hr tablet Take 1 tablet (50 mg total) by mouth daily. Take with or immediately following a meal.  . [DISCONTINUED] simvastatin (ZOCOR) 20 MG tablet Take 1 tablet (20 mg total) by mouth at bedtime.   Current Facility-Administered Medications for the 08/05/17 encounter (Office Visit)  with Burtis Junes, NP  Medication  . 0.9 %  sodium chloride infusion     Allergies: Allergies  Allergen Reactions  . Triprolidine-Pseudoephedrine Hives    Ingredient in Actifed    Social History: The patient  reports that she quit smoking about 36 years ago. She has never used smokeless tobacco. She reports that she drinks about 2.4 oz of alcohol per week. She reports that she does not use drugs.   Family History: The patient's family history includes Asthma in her unknown relative; Atrial fibrillation in her brother, father, and other; CAD in her father; Clotting disorder in her unknown relative; Colon cancer in her unknown relative; Colon cancer (age of onset: 66) in her  father; Coronary artery disease in her unknown relative; Heart attack (age of onset: 63) in her mother; Hyperlipidemia in her unknown relative; Hypertension in her unknown relative; Osteoporosis in her unknown relative; Skin cancer in her brother; Thyroid disease in her unknown relative.   Review of Systems: Please see the history of present illness.   Otherwise, the review of systems is positive for none.   All other systems are reviewed and negative.   Physical Exam: VS:  BP (!) 170/90 (BP Location: Left Arm, Patient Position: Sitting, Cuff Size: Normal)   Pulse 60   Ht 5' 4"  (1.626 m)   Wt 147 lb 12.8 oz (67 kg)   BMI 25.37 kg/m  .  BMI Body mass index is 25.37 kg/m.  Wt Readings from Last 3 Encounters:  08/05/17 147 lb 12.8 oz (67 kg)  03/25/17 148 lb (67.1 kg)  03/11/17 148 lb 12.8 oz (67.5 kg)    General: Pleasant. She looks younger than her stated age. Alert in no acute distress.   HEENT: Normal.  Neck: Supple, no JVD, carotid bruits, or masses noted.  Cardiac: Regular rate and rhythm. No murmurs, rubs, or gallops. No edema.  Respiratory:  Lungs are clear to auscultation bilaterally with normal work of breathing.  GI: Soft and nontender.  MS: No deformity or atrophy. Gait and ROM intact.  Skin: Warm and dry. Color is normal.  Neuro:  Strength and sensation are intact and no gross focal deficits noted.  Psych: Alert, appropriate and with normal affect.   LABORATORY DATA:  EKG:  EKG is ordered today. This demonstrates NSR  Lab Results  Component Value Date   WBC 5.0 07/16/2016   HGB 14.7 07/16/2016   HCT 42.1 07/16/2016   PLT 146.0 (L) 07/16/2016   GLUCOSE 90 03/10/2017   CHOL 123 03/10/2017   TRIG 141.0 03/10/2017   HDL 47.60 03/10/2017   LDLDIRECT 77.0 07/16/2016   LDLCALC 47 03/10/2017   ALT 14 03/10/2017   AST 17 03/10/2017   NA 142 03/10/2017   K 3.9 03/10/2017   CL 103 03/10/2017   CREATININE 0.88 03/10/2017   BUN 20 03/10/2017   CO2 30 03/10/2017    TSH 1.05 06/28/2015     BNP (last 3 results) No results for input(s): BNP in the last 8760 hours.  ProBNP (last 3 results) No results for input(s): PROBNP in the last 8760 hours.   Other Studies Reviewed Today:  CT CARDIACFINDINGS10/2018: Non-cardiac: See separate report from [facility].  Ascending Aorta: Normal size. No calcifications.  Pericardium: Normal.  Coronary arteries: Normal origin.  IMPRESSION: Coronary calcium score of 0. This was 0 percentile for age and sex matched control.  Ena Dawley   Electronically Signed By: Ena Dawley On: 11/11/2016 07:49  EchoStudy Conclusions7/2018  - Left ventricle: The cavity size was normal. Wall thickness was normal. Systolic function was normal. The estimated ejection fraction was in the range of 60% to 65%. Wall motion was normal; there were no regional wall motion abnormalities. Doppler parameters are consistent with abnormal left ventricular relaxation (grade 1 diastolic dysfunction). - Mitral valve: There was mild regurgitation. - Tricuspid valve: There was trivial regurgitation. - Pulmonary arteries: Systolic pressure was mildly increased. PA peak pressure: 38 mm Hg (S).  Assessment/Plan:  1. HTN - Recheck by me is down to 140/78. Her readings from home are excellent. No changes made. Meds refilled today.  2. Prior chest pain - has not recurred. Would follow.   3. Diastolic dysfunction - she has had some swelling with excessive salt - she understands the need for salt restriction and continued BP control.   4. HLD - on statin therapy - calcium scoring 0.She is getting labs today.   Current medicines are reviewed with the patient today.  The patient does not have concerns regarding medicines other than what has been noted above.  The following changes have been made:  See above.  Labs/ tests ordered today include:    Orders Placed This  Encounter  Procedures  . Basic metabolic panel  . Hepatic function panel  . Lipid panel  . EKG 12-Lead     Disposition:   FU with me in 6 months.   Patient is agreeable to this plan and will call if any problems develop in the interim.   SignedTruitt Merle, NP  08/05/2017 9:35 AM  [facility] [address] [place], [place]  [address] Phone: [phone] Fax: [phone]

## 2017-09-30 DIAGNOSIS — Z23 Encounter for immunization: Secondary | ICD-10-CM | POA: Diagnosis not present

## 2017-10-15 DIAGNOSIS — L570 Actinic keratosis: Secondary | ICD-10-CM | POA: Diagnosis not present

## 2017-10-15 DIAGNOSIS — L578 Other skin changes due to chronic exposure to nonionizing radiation: Secondary | ICD-10-CM | POA: Diagnosis not present

## 2017-10-15 DIAGNOSIS — L821 Other seborrheic keratosis: Secondary | ICD-10-CM | POA: Diagnosis not present

## 2017-10-15 DIAGNOSIS — Z85828 Personal history of other malignant neoplasm of skin: Secondary | ICD-10-CM | POA: Diagnosis not present

## 2017-10-15 DIAGNOSIS — D1801 Hemangioma of skin and subcutaneous tissue: Secondary | ICD-10-CM | POA: Diagnosis not present

## 2017-10-15 DIAGNOSIS — D225 Melanocytic nevi of trunk: Secondary | ICD-10-CM | POA: Diagnosis not present

## 2017-12-09 DIAGNOSIS — H33303 Unspecified retinal break, bilateral: Secondary | ICD-10-CM | POA: Diagnosis not present

## 2017-12-09 DIAGNOSIS — H43813 Vitreous degeneration, bilateral: Secondary | ICD-10-CM | POA: Diagnosis not present

## 2017-12-09 DIAGNOSIS — H524 Presbyopia: Secondary | ICD-10-CM | POA: Diagnosis not present

## 2017-12-09 DIAGNOSIS — H2513 Age-related nuclear cataract, bilateral: Secondary | ICD-10-CM | POA: Diagnosis not present

## 2018-02-03 ENCOUNTER — Ambulatory Visit (INDEPENDENT_AMBULATORY_CARE_PROVIDER_SITE_OTHER): Payer: Medicare Other | Admitting: Nurse Practitioner

## 2018-02-03 ENCOUNTER — Encounter: Payer: Self-pay | Admitting: Nurse Practitioner

## 2018-02-03 VITALS — BP 148/80 | HR 60 | Ht 65.0 in | Wt 151.0 lb

## 2018-02-03 DIAGNOSIS — I5189 Other ill-defined heart diseases: Secondary | ICD-10-CM

## 2018-02-03 DIAGNOSIS — E7849 Other hyperlipidemia: Secondary | ICD-10-CM

## 2018-02-03 DIAGNOSIS — I10 Essential (primary) hypertension: Secondary | ICD-10-CM

## 2018-02-03 LAB — BASIC METABOLIC PANEL
BUN/Creatinine Ratio: 18 (ref 12–28)
BUN: 16 mg/dL (ref 8–27)
CO2: 26 mmol/L (ref 20–29)
Calcium: 9.7 mg/dL (ref 8.7–10.3)
Chloride: 102 mmol/L (ref 96–106)
Creatinine, Ser: 0.89 mg/dL (ref 0.57–1.00)
GFR calc Af Amer: 76 mL/min/{1.73_m2} (ref >59)
GFR calc non Af Amer: 66 mL/min/{1.73_m2} (ref >59)
Glucose: 69 mg/dL (ref 65–99)
Potassium: 4.4 mmol/L (ref 3.5–5.2)
Sodium: 142 mmol/L (ref 134–144)

## 2018-02-03 LAB — HEPATIC FUNCTION PANEL
ALT: 14 IU/L (ref 0–32)
AST: 18 IU/L (ref 0–40)
Albumin: 4.5 g/dL (ref 3.6–4.8)
Alkaline Phosphatase: 56 IU/L (ref 39–117)
Bilirubin Total: 0.4 mg/dL (ref 0.0–1.2)
Bilirubin, Direct: 0.1 mg/dL (ref 0.00–0.40)
Total Protein: 6.7 g/dL (ref 6.0–8.5)

## 2018-02-03 LAB — CBC
Hematocrit: 36.2 % (ref 34.0–46.6)
Hemoglobin: 12.2 g/dL (ref 11.1–15.9)
MCH: 30.9 pg (ref 26.6–33.0)
MCHC: 33.7 g/dL (ref 31.5–35.7)
MCV: 92 fL (ref 79–97)
Platelets: 171 10*3/uL (ref 150–450)
RBC: 3.95 x10E6/uL (ref 3.77–5.28)
RDW: 12.1 % (ref 11.7–15.4)
WBC: 4.5 10*3/uL (ref 3.4–10.8)

## 2018-02-03 LAB — LIPID PANEL
Chol/HDL Ratio: 2.4 ratio (ref 0.0–4.4)
Cholesterol, Total: 127 mg/dL (ref 100–199)
HDL: 53 mg/dL (ref >39)
LDL Calculated: 41 mg/dL (ref 0–99)
Triglycerides: 166 mg/dL — ABNORMAL HIGH (ref 0–149)
VLDL Cholesterol Cal: 33 mg/dL (ref 5–40)

## 2018-02-03 MED ORDER — AMLODIPINE BESYLATE 5 MG PO TABS: 2.5000 mg | ORAL_TABLET | Freq: Every day | ORAL | 3 refills | Status: DC

## 2018-02-03 NOTE — Progress Notes (Signed)
CARDIOLOGY OFFICE NOTE  Date:  02/03/2018    Tiffany Graham Date of Birth: 02/06/1950 Medical Record #[id number]  PCP:  Kmari Held, DO  Cardiologist:  Gillian Shields    Chief Complaint  Patient presents with  . Congestive Heart Failure  . Hypertension    6 month check - seen for Dr. Johnsie Cancel    History of Present Illness: Tiffany Graham is a 68 y.o. female who presents today for a 6 month check. Seen for Dr. Johnsie Cancel.   She was referred herein [facility] diastolic dysfunction and murmur. She has HTN and HLD. Remote smoker but quit over 30 years ago. Echo from 7/18 showed normal EF and grade 1 DD.   Seen here in September of 2018 - BP poorly controlled. Was unable to have a stress echo due to holding beta blocker and BP was over 623 systolic.I then saw her in November - added Norvasc for BP control and on follow up in the HTN clinic - she was doing ok.Last visit with me in July of 2019. She takes her Hyzaar and Norvasc at night and her beta blocker in the AM.  Comes in today. Here alone. She is still playing pickle ball. She has had the onset of more lightheadedness and feeling faint over the past week - BP over the past week running low normal at home. She had not been checking prior to this. She is trying to eat less carbs. She has no chest pain. Her breathing is fine. Still waiting to move to [facility] - husband has not sold his business yet. She has noted some cramps - primarily in the left calf - this comes and goes - sometimes she wonders if it is statin related.   Past Medical History:  Diagnosis Date  . Allergy   . Cataract   . Hyperlipidemia   . Hypertension   . 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 APPENDECTOMY  1991  . CHOLECYSTECTOMY  2004  . TOTAL ABDOMINAL HYSTERECTOMY  1991   with BSO, secondary fibroids  . TUBAL LIGATION  1987   first one 1974, then reversed 1983      Medications: Current Meds  Medication Sig  . aspirin 81 MG tablet Take 81 mg by mouth daily.  . calcium carbonate (OS-CAL) 600 MG TABS tablet Take 600 mg by mouth 2 (two) times daily with a meal.  . Glucosamine HCl (GLUCOSAMINE PO) Take 1 tablet by mouth daily.  Marland Kitchen losartan-hydrochlorothiazide (HYZAAR) 100-12.5 MG tablet Take 1 tablet by mouth daily.  . metoprolol succinate (TOPROL-XL) 50 MG 24 hr tablet Take 1 tablet (50 mg total) by mouth daily. Take with or immediately following a meal.  . Omega-3 Fatty Acids (FISH OIL) 1000 MG CAPS Take 1 capsule by mouth daily.  . simvastatin (ZOCOR) 20 MG tablet Take 1 tablet (20 mg total) by mouth at bedtime.     Allergies: Allergies  Allergen Reactions  . Triprolidine-Pseudoephedrine Hives    Ingredient in Actifed    Social History: The patient  reports that she quit smoking about 37 years ago. She has never used smokeless tobacco. She reports current alcohol use of about 4.0 standard drinks of alcohol per week. She reports that she does not use drugs.   Family History: The patient's family history includes Asthma in her unknown relative; Atrial fibrillation in her brother, father, and another family member; CAD in  her father; Clotting disorder in her unknown relative; Colon cancer in her unknown relative; Colon cancer (age of onset: 32) in her father; Coronary artery disease in her unknown relative; Heart attack (age of onset: 76) in her mother; Hyperlipidemia in her unknown relative; Hypertension in her unknown relative; Osteoporosis in her unknown relative; Skin cancer in her brother; Thyroid disease in her unknown relative.   Review of Systems: Please see the history of present illness.   Otherwise, the review of systems is positive for none.   All other systems are reviewed and negative.   Physical Exam: VS:  BP (!) 148/80 (BP Location: Left Arm, Patient Position: Sitting, Cuff Size: Normal)   Pulse 60   Ht 5' 5"  (1.651 m)   Wt  151 lb (68.5 kg)   SpO2 97% Comment: at rest  BMI 25.13 kg/m  .  BMI Body mass index is 25.13 kg/m.  Wt Readings from Last 3 Encounters:  02/03/18 151 lb (68.5 kg)  08/05/17 147 lb 12.8 oz (67 kg)  03/25/17 148 lb (67.1 kg)    General: Pleasant. Well developed, well nourished and in no acute distress.   HEENT: Normal.  Neck: Supple, no JVD, carotid bruits, or masses noted.  Cardiac: Regular rate and rhythm. No murmurs, rubs, or gallops. No edema.  Respiratory:  Lungs are clear to auscultation bilaterally with normal work of breathing.  GI: Soft and nontender.  MS: No deformity or atrophy. Gait and ROM intact.  Skin: Warm and dry. Color is normal.  Neuro:  Strength and sensation are intact and no gross focal deficits noted.  Psych: Alert, appropriate and with normal affect.   LABORATORY DATA:  EKG:  EKG is not ordered today.  Lab Results  Component Value Date   WBC 5.0 07/16/2016   HGB 14.7 07/16/2016   HCT 42.1 07/16/2016   PLT 146.0 (L) 07/16/2016   GLUCOSE 91 08/05/2017   CHOL 162 08/05/2017   TRIG 257 (H) 08/05/2017   HDL 54 08/05/2017   LDLDIRECT 77.0 07/16/2016   LDLCALC 57 08/05/2017   ALT 15 08/05/2017   AST 22 08/05/2017   NA 140 08/05/2017   K 4.2 08/05/2017   CL 100 08/05/2017   CREATININE 0.79 08/05/2017   BUN 15 08/05/2017   CO2 25 08/05/2017   TSH 1.05 06/28/2015     BNP (last 3 results) No results for input(s): BNP in the last 8760 hours.  ProBNP (last 3 results) No results for input(s): PROBNP in the last 8760 hours.   Other Studies Reviewed Today:  CT CARDIACFINDINGS10/2018: Non-cardiac: See separate report from [facility].  Ascending Aorta: Normal size. No calcifications.  Pericardium: Normal.  Coronary arteries: Normal origin.  IMPRESSION: Coronary calcium score of 0. This was 0 percentile for age and sex matched control.  Ena Dawley   Electronically Signed By: Ena Dawley On:  11/11/2016 07:49    EchoStudy Conclusions7/2018  - Left ventricle: The cavity size was normal. Wall thickness was normal. Systolic function was normal. The estimated ejection fraction was in the range of 60% to 65%. Wall motion was normal; there were no regional wall motion abnormalities. Doppler parameters are consistent with abnormal left ventricular relaxation (grade 1 diastolic dysfunction). - Mitral valve: There was mild regurgitation. - Tricuspid valve: There was trivial regurgitation. - Pulmonary arteries: Systolic pressure was mildly increased. PA peak pressure: 38 mm Hg (S).  Assessment/Plan:  1. HTN -Lower readings at home - symptomatic - she admits she likes salt - will  try cutting the Norvasc back to 1/2 - she will monitor and resume the full dose if BP starts creeping up at home.   2. Diastolic dysfunction - she admits she likes salt. Has better BP control at home - see #1.   3. HLD - on statin - lab today - ?myaglia - ok to take drug holiday for a few weeks - she is to let me know if this helps her cramps - if it does not, resume current regimen.   Current medicines are reviewed with the patient today.  The patient does not have concerns regarding medicines other than what has been noted above.  The following changes have been made:  See above.  Labs/ tests ordered today include:    Orders Placed This Encounter  Procedures  . Basic metabolic panel  . CBC  . Hepatic function panel  . Lipid panel     Disposition:   FU with me in 6 months.   Patient is agreeable to this plan and will call if any problems develop in the interim.   SignedTruitt Merle, NP  02/03/2018 9:30 AM  [facility] [address] [place], [place]  [address] Phone: [phone] Fax: [phone]

## 2018-02-03 NOTE — Patient Instructions (Addendum)
We will be checking the following labs today - BMET, CBC, LFTs and Lipids  Medication Instructions:    Continue with your current medicines. But  Ok to try cutting the Norvasc in half - monitor your blood pressure - if it creeps back up - go back to the whole tablet  Ok if you want to take a drug holiday from your simvastatin - stop for 2 weeks and see if gets better or not. Let me know if it does - otherwise, resume.    If you need a refill on your cardiac medications before your next appointment, please call your pharmacy.     Testing/Procedures To Be Arranged:  N/A  Follow-Up:   See me in 6 months    At [facility], you and your health needs are our priority.  As part of our continuing mission to provide you with exceptional heart care, we have created designated Provider Care Teams.  These Care Teams include your primary Cardiologist (physician) and Advanced Practice Providers (APPs -  Physician Assistants and Nurse Practitioners) who all work together to provide you with the care you need, when you need it.  Special Instructions:  . None  Call the [place] office at [phone] if you have any questions, problems or concerns.

## 2018-02-05 ENCOUNTER — Other Ambulatory Visit: Payer: Self-pay | Admitting: Family Medicine

## 2018-02-05 DIAGNOSIS — Z1231 Encounter for screening mammogram for malignant neoplasm of breast: Secondary | ICD-10-CM

## 2018-02-10 DIAGNOSIS — M79662 Pain in left lower leg: Secondary | ICD-10-CM | POA: Diagnosis not present

## 2018-02-25 DIAGNOSIS — M79662 Pain in left lower leg: Secondary | ICD-10-CM | POA: Diagnosis not present

## 2018-02-26 DIAGNOSIS — S86112D Strain of other muscle(s) and tendon(s) of posterior muscle group at lower leg level, left leg, subsequent encounter: Secondary | ICD-10-CM | POA: Diagnosis not present

## 2018-03-03 DIAGNOSIS — S86112D Strain of other muscle(s) and tendon(s) of posterior muscle group at lower leg level, left leg, subsequent encounter: Secondary | ICD-10-CM | POA: Diagnosis not present

## 2018-03-05 DIAGNOSIS — S86112D Strain of other muscle(s) and tendon(s) of posterior muscle group at lower leg level, left leg, subsequent encounter: Secondary | ICD-10-CM | POA: Diagnosis not present

## 2018-03-10 DIAGNOSIS — S86112D Strain of other muscle(s) and tendon(s) of posterior muscle group at lower leg level, left leg, subsequent encounter: Secondary | ICD-10-CM | POA: Diagnosis not present

## 2018-03-12 DIAGNOSIS — S86112D Strain of other muscle(s) and tendon(s) of posterior muscle group at lower leg level, left leg, subsequent encounter: Secondary | ICD-10-CM | POA: Diagnosis not present

## 2018-03-17 DIAGNOSIS — S86112D Strain of other muscle(s) and tendon(s) of posterior muscle group at lower leg level, left leg, subsequent encounter: Secondary | ICD-10-CM | POA: Diagnosis not present

## 2018-03-19 DIAGNOSIS — S86112D Strain of other muscle(s) and tendon(s) of posterior muscle group at lower leg level, left leg, subsequent encounter: Secondary | ICD-10-CM | POA: Diagnosis not present

## 2018-03-20 ENCOUNTER — Ambulatory Visit: Payer: Medicare Other

## 2018-04-16 ENCOUNTER — Ambulatory Visit: Payer: Medicare Other

## 2018-05-21 ENCOUNTER — Ambulatory Visit: Payer: Medicare Other

## 2018-06-30 ENCOUNTER — Ambulatory Visit: Payer: Medicare Other

## 2018-07-13 MED ORDER — PRAVASTATIN SODIUM 20 MG PO TABS: 20.0000 mg | ORAL_TABLET | Freq: Every evening | ORAL | 3 refills | Status: DC

## 2018-07-13 NOTE — Telephone Encounter (Signed)
S/w pt is agreeable to treatment plan.  Will start pravastatin (20 mg) daily, sent in today to pt's requested pharmacy, medication list updated.  Will keep f/u appt in July to discuss further.

## 2018-08-10 ENCOUNTER — Telehealth: Payer: Self-pay | Admitting: Nurse Practitioner

## 2018-08-10 NOTE — Telephone Encounter (Signed)
New Message ° ° ° °Left message to confirm appt and answer covid questions  °

## 2018-08-10 NOTE — Progress Notes (Signed)
CARDIOLOGY OFFICE NOTE  Date:  08/11/2018    Tiffany Graham Date of Birth: 12/14/1950 Medical Record #[id number]  PCP:  Danamarie Held, DO  Cardiologist:  Gillian Shields    Chief Complaint  Patient presents with  . Follow-up    Seen for Dr. Johnsie Cancel    History of Present Illness: Tiffany Graham is a 68 y.o. female who presents today for a 6 month check. Seen for Dr. Johnsie Cancel. She has primarily followed with me.   She was referred herein [id number] diastolic dysfunction and murmur. She has HTN and HLD. Remote smoker but quit over 30 years ago. Echo from 7/18 showed normal EF and grade 1 DD.   Seen here in Septemberof 2018- BP poorly controlled. Was unable to have a stress echo due to holding beta blocker and BP was over 570 systolic.I then saw her in November - added Norvasc for BP control and on follow up in the HTN clinic - she was doing ok.Last visit with me in July of 2019. She takes her Hyzaar and Norvasc at night and her beta blocker in the AM.  I last saw her in January - there was still talk of moving to [facility].   The patient does not have symptoms concerning for COVID-19 infection (fever, chills, cough, or new shortness of breath).   Comes in today. Here alone. She is doing well. Has gotten back to playing pickle ball. BP readings from home look good. Always higher here. She is fasting. She had one "weird" spell back in April - woke up with chest heaviness - lasted 4 hours - then she went on and played pickle ball - it has not recurred. Her husband had the same thing 5 days later. His has not recurred. Overall, she feels like she is doing ok.   Past Medical History:  Diagnosis Date  . Allergy   . Cataract   . Hyperlipidemia   . Hypertension   . 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 APPENDECTOMY  1991  . CHOLECYSTECTOMY  2004  . TOTAL ABDOMINAL HYSTERECTOMY  1991   with BSO,  secondary fibroids  . TUBAL LIGATION  1987   first one 1974, then reversed 1983     Medications: Current Meds  Medication Sig  . amLODipine (NORVASC) 5 MG tablet Take 0.5 tablets (2.5 mg total) by mouth daily.  Marland Kitchen aspirin 81 MG tablet Take 81 mg by mouth daily.  . calcium carbonate (OS-CAL) 600 MG TABS tablet Take 600 mg by mouth 2 (two) times daily with a meal.  . Glucosamine HCl (GLUCOSAMINE PO) Take 1 tablet by mouth daily.  Marland Kitchen losartan-hydrochlorothiazide (HYZAAR) 100-12.5 MG tablet Take 1 tablet by mouth daily.  . metoprolol succinate (TOPROL-XL) 50 MG 24 hr tablet Take 1 tablet (50 mg total) by mouth daily. Take with or immediately following a meal.  . Omega-3 Fatty Acids (FISH OIL) 1000 MG CAPS Take 1 capsule by mouth daily.  . pravastatin (PRAVACHOL) 20 MG tablet Take 1 tablet (20 mg total) by mouth every evening.     Allergies: Allergies  Allergen Reactions  . Triprolidine-Pseudoephedrine Hives    Ingredient in Actifed    Social History: The patient  reports that she quit smoking about 37 years ago. She has never used smokeless tobacco. She reports current alcohol use of about 4.0 standard drinks of alcohol per week. She reports that she does  not use drugs.   Family History: The patient's family history includes Asthma in her unknown relative; Atrial fibrillation in her brother, father, and another family member; CAD in her father; Clotting disorder in her unknown relative; Colon cancer in her unknown relative; Colon cancer (age of onset: 24) in her father; Coronary artery disease in her unknown relative; Heart attack (age of onset: 63) in her mother; Hyperlipidemia in her unknown relative; Hypertension in her unknown relative; Osteoporosis in her unknown relative; Skin cancer in her brother; Thyroid disease in her unknown relative.   Review of Systems: Please see the history of present illness.   All other systems are reviewed and negative.   Physical Exam: VS:  BP (!)  156/90 (BP Location: Left Arm, Patient Position: Sitting, Cuff Size: Normal)   Pulse 67   Ht 5' 5"  (1.651 m)   Wt 149 lb 12.8 oz (67.9 kg)   BMI 24.93 kg/m  .  BMI Body mass index is 24.93 kg/m.  Wt Readings from Last 3 Encounters:  08/11/18 149 lb 12.8 oz (67.9 kg)  02/03/18 151 lb (68.5 kg)  08/05/17 147 lb 12.8 oz (67 kg)    General: Pleasant. Well developed, well nourished and in no acute distress.   HEENT: Normal.  Neck: Supple, no JVD, carotid bruits, or masses noted.  Cardiac: Regular rate and rhythm. No murmurs, rubs, or gallops. No edema.  Respiratory:  Lungs are clear to auscultation bilaterally with normal work of breathing.  GI: Soft and nontender.  MS: No deformity or atrophy. Gait and ROM intact.  Skin: Warm and dry. Color is normal.  Neuro:  Strength and sensation are intact and no gross focal deficits noted.  Psych: Alert, appropriate and with normal affect.   LABORATORY DATA:  EKG:  EKG is ordered today. This demonstrates NSR - no acute changes. HR is 67.  Lab Results  Component Value Date   WBC 4.5 02/03/2018   HGB 12.2 02/03/2018   HCT 36.2 02/03/2018   PLT 171 02/03/2018   GLUCOSE 69 02/03/2018   CHOL 127 02/03/2018   TRIG 166 (H) 02/03/2018   HDL 53 02/03/2018   LDLDIRECT 77.0 07/16/2016   LDLCALC 41 02/03/2018   ALT 14 02/03/2018   AST 18 02/03/2018   NA 142 02/03/2018   K 4.4 02/03/2018   CL 102 02/03/2018   CREATININE 0.89 02/03/2018   BUN 16 02/03/2018   CO2 26 02/03/2018   TSH 1.05 06/28/2015     BNP (last 3 results) No results for input(s): BNP in the last 8760 hours.  ProBNP (last 3 results) No results for input(s): PROBNP in the last 8760 hours.   Other Studies Reviewed Today:  CT CARDIACFINDINGS10/2018: Non-cardiac: See separate report from [facility].  Ascending Aorta: Normal size. No calcifications.  Pericardium: Normal.  Coronary arteries: Normal origin.  IMPRESSION: Coronary calcium score of  0. This was 0 percentile for age and sex matched control.  Ena Dawley   Electronically Signed By: Ena Dawley On: 11/11/2016 07:49   EchoStudy Conclusions7/2018  - Left ventricle: The cavity size was normal. Wall thickness was normal. Systolic function was normal. The estimated ejection fraction was in the range of 60% to 65%. Wall motion was normal; there were no regional wall motion abnormalities. Doppler parameters are consistent with abnormal left ventricular relaxation (grade 1 diastolic dysfunction). - Mitral valve: There was mild regurgitation. - Tricuspid valve: There was trivial regurgitation. - Pulmonary arteries: Systolic pressure was mildly increased. PA peak pressure:  38 mm Hg (S).  Assessment/Plan:  1. HTN -typically has better outpatient control - no changes made today. Refilled the Norvasc. Continue to restrict salt - she does admit to liking salt.   2. Diastolic dysfunction - has good BP control at home. Not short of breath. Salt restriction advised again.   3. HLD - on Pravachol - labs today.   4. Atypical chest pain - worrisome - this happened back in April - she went and played pickle ball that same day - it has not recurred - she is advised to let me know if this recurs. It is odd that her husband had the same thing 5 days later - neither had any other symptoms.   5. COVID-19 Education: The signs and symptoms of COVID-19 were discussed with the patient and how to seek care for testing (follow up with PCP or arrange E-visit).  The importance of social distancing, staying at home, hand hygiene and wearing a mask when out in public were discussed today.  Current medicines are reviewed with the patient today.  The patient does not have concerns regarding medicines other than what has been noted above.  The following changes have been made:  See above.  Labs/ tests ordered today include:    Orders Placed This Encounter   Procedures  . Basic metabolic panel  . CBC  . Lipid panel  . Hepatic function panel  . EKG 12-Lead     Disposition:   FU with me in 6 months.    Patient is agreeable to this plan and will call if any problems develop in the interim.   SignedTruitt Merle, NP  08/11/2018 9:26 AM  [facility] [address] [place], [place]  [address] Phone: [phone] Fax: [phone]

## 2018-08-11 ENCOUNTER — Encounter: Payer: Self-pay | Admitting: Nurse Practitioner

## 2018-08-11 ENCOUNTER — Other Ambulatory Visit: Payer: Self-pay

## 2018-08-11 ENCOUNTER — Ambulatory Visit
Admission: RE | Admit: 2018-08-11 | Discharge: 2018-08-11 | Disposition: A | Discharge status: Home / Self Care | Payer: Medicare Other | Source: Ambulatory Visit | Attending: Family Medicine | Admitting: Family Medicine

## 2018-08-11 ENCOUNTER — Ambulatory Visit (INDEPENDENT_AMBULATORY_CARE_PROVIDER_SITE_OTHER): Payer: Medicare Other | Admitting: Nurse Practitioner

## 2018-08-11 VITALS — BP 156/90 | HR 67 | Ht 65.0 in | Wt 149.8 lb

## 2018-08-11 DIAGNOSIS — E7849 Other hyperlipidemia: Secondary | ICD-10-CM

## 2018-08-11 DIAGNOSIS — Z1231 Encounter for screening mammogram for malignant neoplasm of breast: Secondary | ICD-10-CM | POA: Diagnosis not present

## 2018-08-11 DIAGNOSIS — I5189 Other ill-defined heart diseases: Secondary | ICD-10-CM

## 2018-08-11 DIAGNOSIS — I10 Essential (primary) hypertension: Secondary | ICD-10-CM | POA: Diagnosis not present

## 2018-08-11 LAB — LIPID PANEL
Chol/HDL Ratio: 3.5 ratio (ref 0.0–4.4)
Cholesterol, Total: 193 mg/dL (ref 100–199)
HDL: 55 mg/dL (ref >39)
LDL Calculated: 97 mg/dL (ref 0–99)
Triglycerides: 206 mg/dL — ABNORMAL HIGH (ref 0–149)
VLDL Cholesterol Cal: 41 mg/dL — ABNORMAL HIGH (ref 5–40)

## 2018-08-11 LAB — CBC
Hematocrit: 38.5 % (ref 34.0–46.6)
Hemoglobin: 13.2 g/dL (ref 11.1–15.9)
MCH: 31.4 pg (ref 26.6–33.0)
MCHC: 34.3 g/dL (ref 31.5–35.7)
MCV: 91 fL (ref 79–97)
Platelets: 173 10*3/uL (ref 150–450)
RBC: 4.21 x10E6/uL (ref 3.77–5.28)
RDW: 12.8 % (ref 11.7–15.4)
WBC: 4.6 10*3/uL (ref 3.4–10.8)

## 2018-08-11 LAB — HEPATIC FUNCTION PANEL
ALT: 15 IU/L (ref 0–32)
AST: 19 IU/L (ref 0–40)
Albumin: 4.8 g/dL (ref 3.8–4.8)
Alkaline Phosphatase: 58 IU/L (ref 39–117)
Bilirubin Total: 0.6 mg/dL (ref 0.0–1.2)
Bilirubin, Direct: 0.14 mg/dL (ref 0.00–0.40)
Total Protein: 6.8 g/dL (ref 6.0–8.5)

## 2018-08-11 LAB — BASIC METABOLIC PANEL
BUN/Creatinine Ratio: 20 (ref 12–28)
BUN: 18 mg/dL (ref 8–27)
CO2: 22 mmol/L (ref 20–29)
Calcium: 9.5 mg/dL (ref 8.7–10.3)
Chloride: 105 mmol/L (ref 96–106)
Creatinine, Ser: 0.9 mg/dL (ref 0.57–1.00)
GFR calc Af Amer: 75 mL/min/{1.73_m2} (ref >59)
GFR calc non Af Amer: 65 mL/min/{1.73_m2} (ref >59)
Glucose: 92 mg/dL (ref 65–99)
Potassium: 4.3 mmol/L (ref 3.5–5.2)
Sodium: 143 mmol/L (ref 134–144)

## 2018-08-11 MED ORDER — AMLODIPINE BESYLATE 2.5 MG PO TABS: 2.5000 mg | ORAL_TABLET | Freq: Every day | ORAL | 3 refills | Status: DC

## 2018-08-11 NOTE — Patient Instructions (Addendum)
After Visit Summary:  We will be checking the following labs today - BMET, CBC, HPF and Lipids   Medication Instructions:    Continue with your current medicines.  I sent in your refill for the lower dose of Norvasc today    If you need a refill on your cardiac medications before your next appointment, please call your pharmacy.     Testing/Procedures To Be Arranged:  N/A  Follow-Up:   See me in 6 months    At [facility], you and your health needs are our priority.  As part of our continuing mission to provide you with exceptional heart care, we have created designated Provider Care Teams.  These Care Teams include your primary Cardiologist (physician) and Advanced Practice Providers (APPs -  Physician Assistants and Nurse Practitioners) who all work together to provide you with the care you need, when you need it.  Special Instructions:  . Stay safe, stay home, wash your hands for at least 20 seconds and wear a mask when out in public.  . It was good to talk with you today.  Marland Kitchen Keep up the good work. Marland Kitchen Keep a check on your BP for me.    Call the [place] office at [phone] if you have any questions, problems or concerns.

## 2018-09-16 ENCOUNTER — Other Ambulatory Visit: Payer: Self-pay | Admitting: Nurse Practitioner

## 2018-09-16 ENCOUNTER — Other Ambulatory Visit: Payer: Self-pay | Admitting: *Deleted

## 2018-09-16 MED ORDER — PRAVASTATIN SODIUM 20 MG PO TABS: 20.0000 mg | ORAL_TABLET | Freq: Every evening | ORAL | 3 refills | Status: DC

## 2018-09-16 NOTE — Telephone Encounter (Signed)
Pt's pharmacy [facility] is requesting a 90 day supply of Pravastatin 20 mg tablet. Would Truitt Merle, NP like to refill this medication for a 90 day supply with refills? Please address

## 2018-09-16 NOTE — Telephone Encounter (Signed)
Tiffany Graham, can we change that into a 90 day supply, it would be cheaper for the pt. Please address

## 2018-10-01 ENCOUNTER — Other Ambulatory Visit: Payer: Self-pay | Admitting: Nurse Practitioner

## 2018-10-09 DIAGNOSIS — Z23 Encounter for immunization: Secondary | ICD-10-CM | POA: Diagnosis not present

## 2018-10-15 DIAGNOSIS — D1801 Hemangioma of skin and subcutaneous tissue: Secondary | ICD-10-CM | POA: Diagnosis not present

## 2018-10-15 DIAGNOSIS — L821 Other seborrheic keratosis: Secondary | ICD-10-CM | POA: Diagnosis not present

## 2018-10-15 DIAGNOSIS — L578 Other skin changes due to chronic exposure to nonionizing radiation: Secondary | ICD-10-CM | POA: Diagnosis not present

## 2018-10-15 DIAGNOSIS — L570 Actinic keratosis: Secondary | ICD-10-CM | POA: Diagnosis not present

## 2018-10-15 DIAGNOSIS — L820 Inflamed seborrheic keratosis: Secondary | ICD-10-CM | POA: Diagnosis not present

## 2018-10-15 DIAGNOSIS — Z85828 Personal history of other malignant neoplasm of skin: Secondary | ICD-10-CM | POA: Diagnosis not present

## 2018-10-15 DIAGNOSIS — D225 Melanocytic nevi of trunk: Secondary | ICD-10-CM | POA: Diagnosis not present

## 2018-11-24 DIAGNOSIS — H43813 Vitreous degeneration, bilateral: Secondary | ICD-10-CM | POA: Diagnosis not present

## 2018-11-24 DIAGNOSIS — H2513 Age-related nuclear cataract, bilateral: Secondary | ICD-10-CM | POA: Diagnosis not present

## 2018-11-24 DIAGNOSIS — H524 Presbyopia: Secondary | ICD-10-CM | POA: Diagnosis not present

## 2018-11-24 DIAGNOSIS — H25013 Cortical age-related cataract, bilateral: Secondary | ICD-10-CM | POA: Diagnosis not present

## 2018-12-16 ENCOUNTER — Other Ambulatory Visit: Payer: Self-pay

## 2019-01-25 ENCOUNTER — Telehealth: Payer: Self-pay | Admitting: Family Medicine

## 2019-01-25 NOTE — Telephone Encounter (Signed)
Patient declined to schedule AWV. SF

## 2019-01-27 IMAGING — CT CT HEART SCORING
2 series · 16 of 20 positions shown, 18 images · non-contrast
Comparison: None.

CLINICAL DATA: Risk stratification

EXAM:
Coronary Calcium Score
TECHNIQUE: The patient was scanned on a Siemens Force scanner. Axial
non-contrast 3 mm slices were carried out through the heart. The
data set was analyzed on a dedicated work station and scored using
the Agatson method.

[Series 3: casc 3.0 i36f 2 bestdiast 69 % · axial · 0.33mm/px · z∈[-218,-112]mm · 8 of 47 slices shown, 10 images]
[im 6/47  vessel]
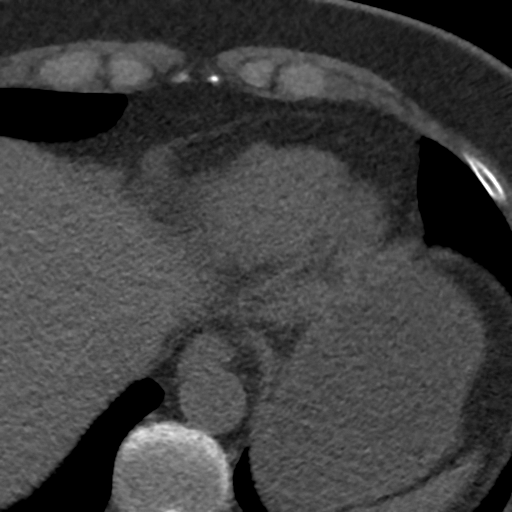
[im 6/47  lung]
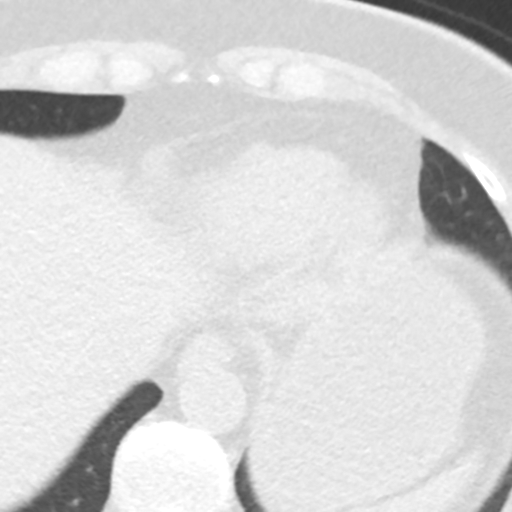
[im 11/47  vessel]
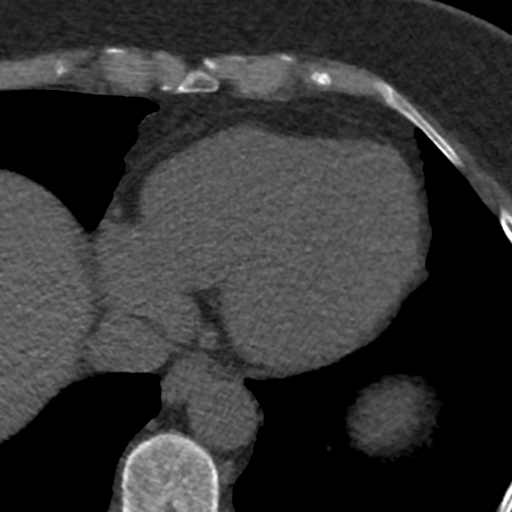
[im 16/47  vessel]
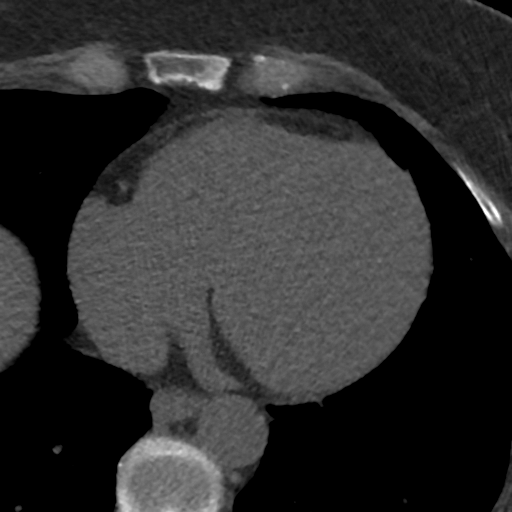
[im 21/47  vessel]
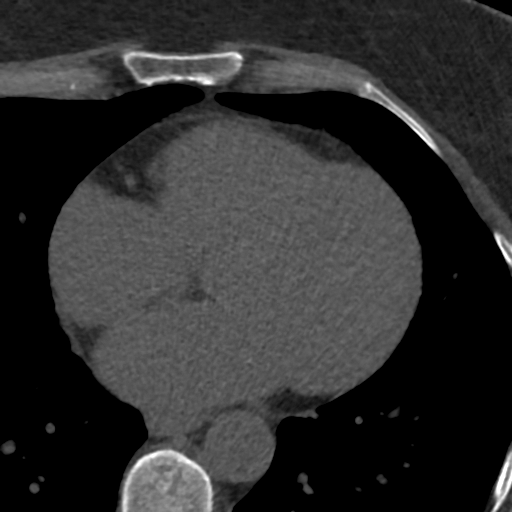
[im 26/47  vessel]
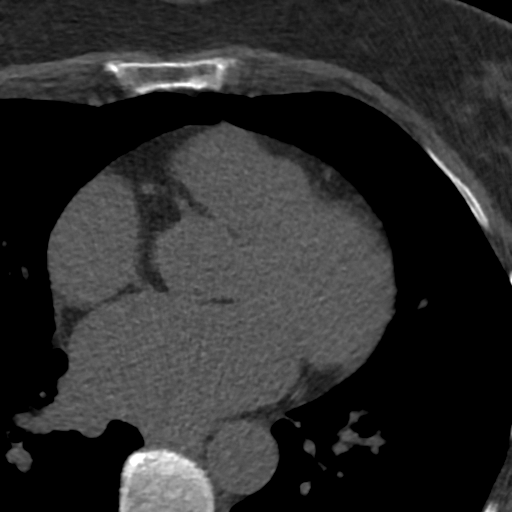
[im 26/47  lung]
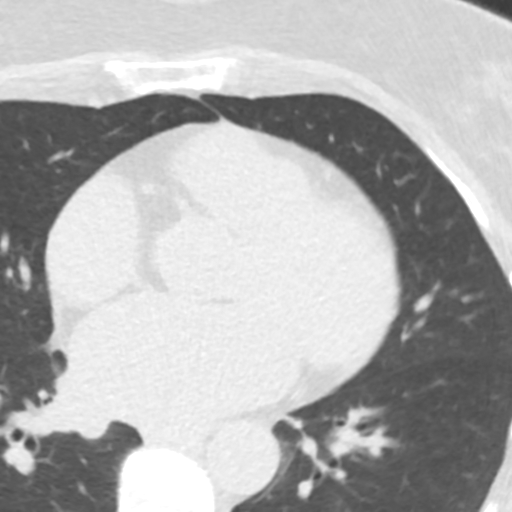
[im 31/47  vessel]
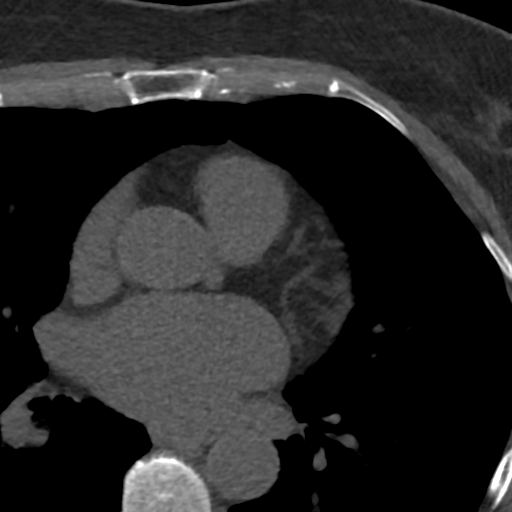
[im 36/47  vessel]
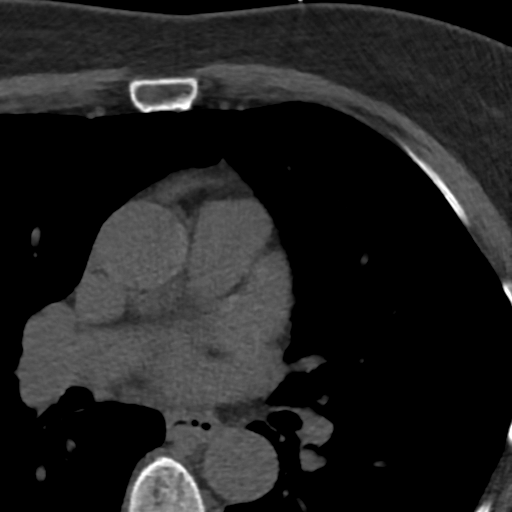
[im 41/47  vessel]
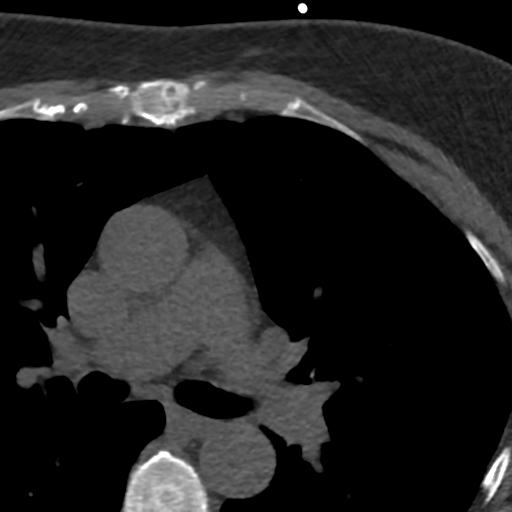

[Series 5: lung st 71 % · axial · 0.63mm/px · z∈[-218,-112]mm · 8 of 47 slices shown]
[im 6/47  lung]
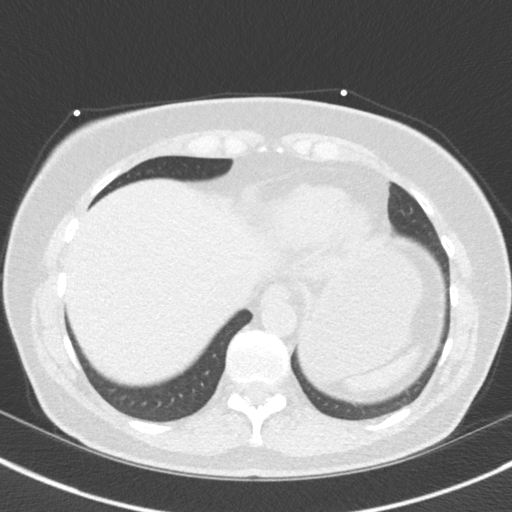
[im 11/47  lung]
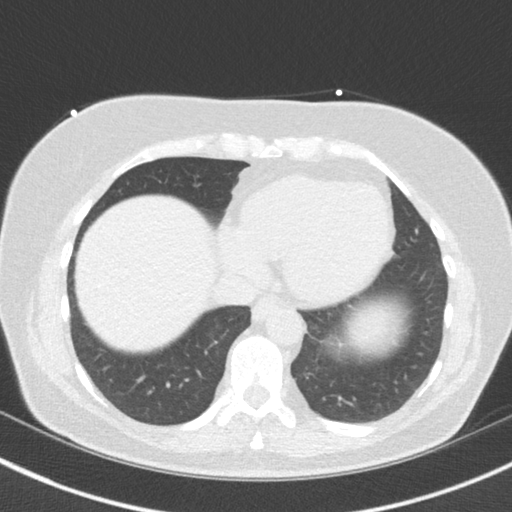
[im 16/47  lung]
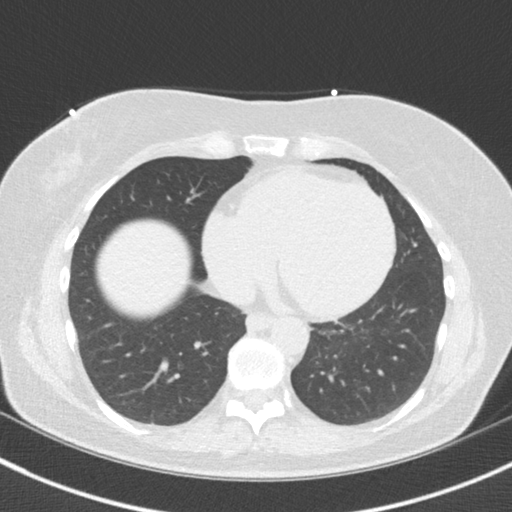
[im 21/47  lung]
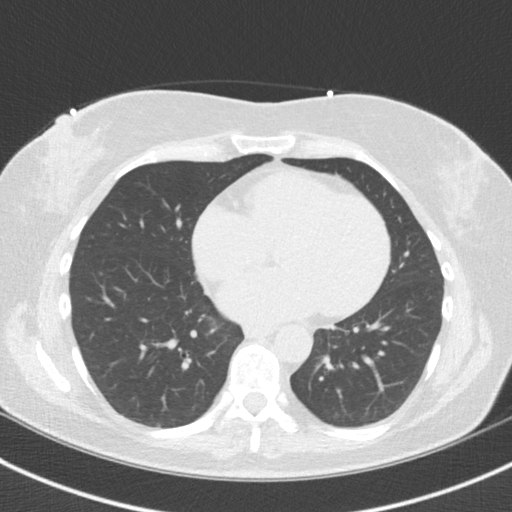
[im 26/47  lung]
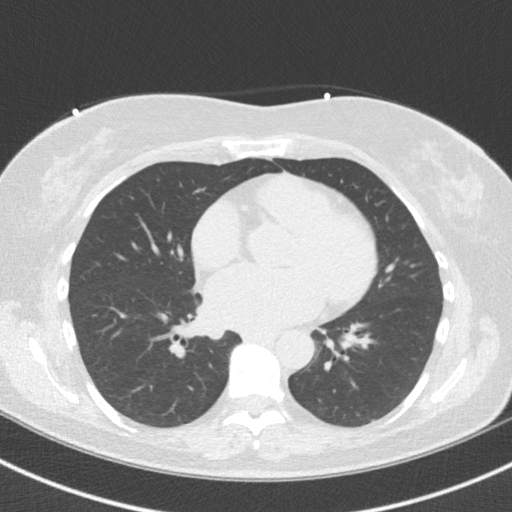
[im 31/47  lung]
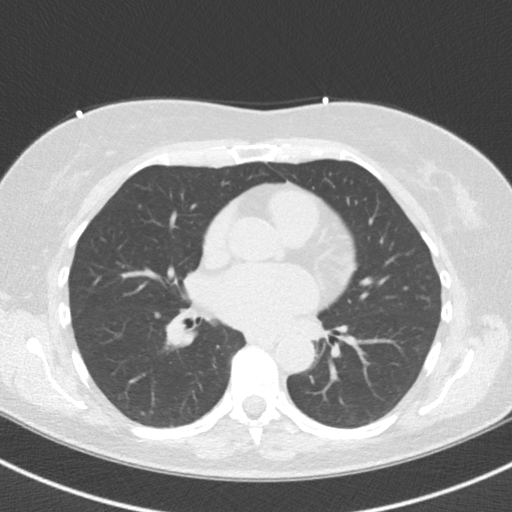
[im 36/47  lung]
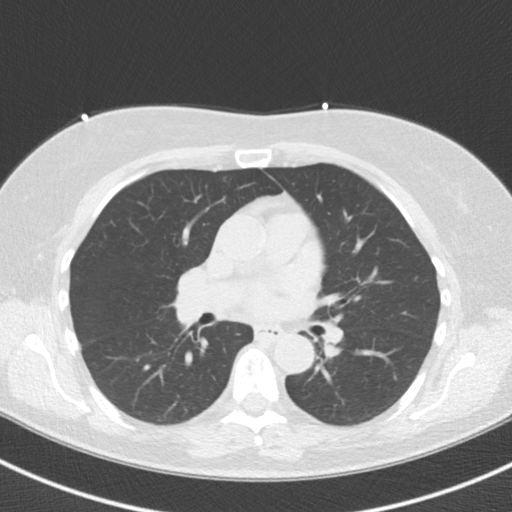
[im 41/47  lung]
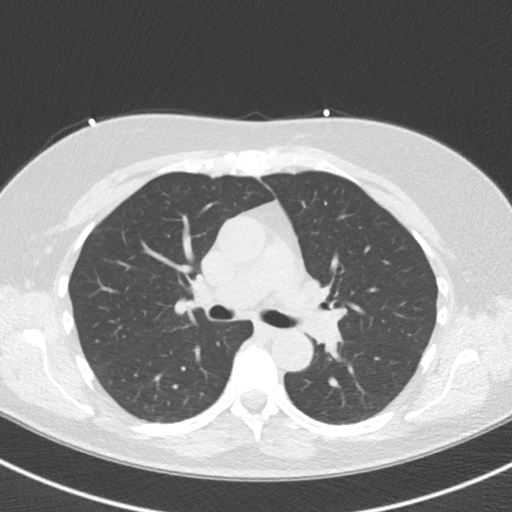

[16 of 20 positions shown; findings below may reference images not displayed]

FINDINGS: Non-cardiac: See separate report from [REDACTED].

Ascending Aorta:  Normal size.  No calcifications.

Pericardium: Normal.

Coronary arteries:  Normal origin.
IMPRESSION: Coronary calcium score of 0. This was 0 percentile for age and sex
matched control.

Galuska Iles

EXAM:
OVER-READ INTERPRETATION  CT CHEST

The following report is an over-read performed by radiologist Dr.
Kviria Bitar [REDACTED] on 11/08/2016. This over-read
does not include interpretation of cardiac or coronary anatomy or
pathology. The coronary calcium score interpretation by the
cardiologist is attached.
FINDINGS: Cardiovascular: Heart is normal size. Visualized aorta normal
caliber.

Mediastinum/Nodes: No adenopathy in the lower mediastinum or hila.

Lungs/Pleura: Visualized lungs are clear.  No effusions.

Upper Abdomen: Imaging into the upper abdomen shows no acute
findings.

Musculoskeletal: Chest wall soft tissues are unremarkable. No acute
bony abnormality.
IMPRESSION: No acute or significant extracardiac abnormality.

## 2019-01-29 NOTE — Progress Notes (Signed)
CARDIOLOGY OFFICE NOTE  Date:  02/02/2019    Tiffany Graham Date of Birth: 01-15-1951 Medical Record #[id number]  PCP:  Clementine Held, DO  Cardiologist:  Gillian Shields   Chief Complaint  Patient presents with  . Follow-up    History of Present Illness: Tiffany Graham is a 69 y.o. female who presents today for a 6 month check. Seen for Dr. Johnsie Cancel. She has primarily followed with me.   She was referred herein [id number] diastolic dysfunction and murmur. She has HTN and HLD. Remote smoker but quit over 30 years ago. Echo from 7/18 showed normal EF and grade 1 DD. She has had prior calcium score of 0.   Seen here in Septemberof 2018- BP poorly controlled. Was unable to have a stress echo due to holding beta blocker and BP was over 703 systolic.I then saw her in November - added Norvasc for BP control and on follow up in the HTN clinic - she was doing ok. Shetakesher Hyzaar and Norvasc at night and her beta blocker in the AM.I last saw her in July - BP typically better at home than in the office - she had had one spell of chest heaviness back in April - husband had same thing 5 days later - this had not recurred.   The patient does not have symptoms concerning for COVID-19 infection (fever, chills, cough, or new shortness of breath).   Comes in today. Here alone. She is doing well. Playing pickleball. Has lost a few pounds. Does [employer] for a few months each year. Feels good. No chest pain. Breathing is good. BP is great. She would like to get her lipids rechecked. May need refill on her statin - other refills sent in today. She has no concerns.   Past Medical History:  Diagnosis Date  . Allergy   . Cataract   . Hyperlipidemia   . Hypertension   . 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 APPENDECTOMY  1991  . CHOLECYSTECTOMY  2004  . TOTAL ABDOMINAL HYSTERECTOMY  1991   with BSO, secondary  fibroids  . TUBAL LIGATION  1987   first one 1974, then reversed 1983     Medications: Current Meds  Medication Sig  . aspirin 81 MG tablet Take 81 mg by mouth daily.  . Glucosamine HCl (GLUCOSAMINE PO) Take 1 tablet by mouth daily.  Marland Kitchen losartan-hydrochlorothiazide (HYZAAR) 100-12.5 MG tablet Take 1 tablet by mouth daily.  . metoprolol succinate (TOPROL-XL) 50 MG 24 hr tablet TAKE ONE TABLET BY MOUTH DAILY WITH OR IMMEDIATELY FOLLOWING A MEAL  . Omega-3 Fatty Acids (FISH OIL) 1000 MG CAPS Take 1 capsule by mouth daily.  . pravastatin (PRAVACHOL) 20 MG tablet Take 1 tablet (20 mg total) by mouth every evening.  . [DISCONTINUED] calcium carbonate (OS-CAL) 600 MG TABS tablet Take 600 mg by mouth 2 (two) times daily with a meal.  . [DISCONTINUED] losartan-hydrochlorothiazide (HYZAAR) 100-12.5 MG tablet TAKE ONE TABLET BY MOUTH ONE TIME DAILY   . [DISCONTINUED] metoprolol succinate (TOPROL-XL) 50 MG 24 hr tablet TAKE ONE TABLET BY MOUTH DAILY WITH OR IMMEDIATELY FOLLOWING A MEAL     Allergies: Allergies  Allergen Reactions  . Triprolidine-Pseudoephedrine Hives    Ingredient in Actifed    Social History: The patient  reports that she quit smoking about 38 years ago. She has never used smokeless tobacco. She reports current alcohol use  of about 4.0 standard drinks of alcohol per week. She reports that she does not use drugs.   Family History: The patient's family history includes Asthma in an other family member; Atrial fibrillation in her brother, father, and another family member; CAD in her father; Clotting disorder in an other family member; Colon cancer in an other family member; Colon cancer (age of onset: 55) in her father; Coronary artery disease in an other family member; Heart attack (age of onset: 77) in her mother; Hyperlipidemia in an other family member; Hypertension in an other family member; Osteoporosis in an other family member; Skin cancer in her brother; Thyroid disease in  an other family member.   Review of Systems: Please see the history of present illness.   All other systems are reviewed and negative.   Physical Exam: VS:  BP 130/70   Pulse (!) 56   Ht 5' 5"  (1.651 m)   Wt 147 lb 12.8 oz (67 kg)   SpO2 99%   BMI 24.60 kg/m  .  BMI Body mass index is 24.6 kg/m.  Wt Readings from Last 3 Encounters:  02/02/19 147 lb 12.8 oz (67 kg)  08/11/18 149 lb 12.8 oz (67.9 kg)  02/03/18 151 lb (68.5 kg)    General: Pleasant. Well developed, well nourished and in no acute distress.   HEENT: Normal.  Neck: Supple, no JVD, carotid bruits, or masses noted.  Cardiac: Regular rate and rhythm. No murmurs, rubs, or gallops. No edema.  Respiratory:  Lungs are clear to auscultation bilaterally with normal work of breathing.  GI: Soft and nontender.  MS: No deformity or atrophy. Gait and ROM intact.  Skin: Warm and dry. Color is normal.  Neuro:  Strength and sensation are intact and no gross focal deficits noted.  Psych: Alert, appropriate and with normal affect.   LABORATORY DATA:  EKG:  EKG is not ordered today.  Lab Results  Component Value Date   WBC 4.6 08/11/2018   HGB 13.2 08/11/2018   HCT 38.5 08/11/2018   PLT 173 08/11/2018   GLUCOSE 92 08/11/2018   CHOL 193 08/11/2018   TRIG 206 (H) 08/11/2018   HDL 55 08/11/2018   LDLDIRECT 77.0 07/16/2016   LDLCALC 97 08/11/2018   ALT 15 08/11/2018   AST 19 08/11/2018   NA 143 08/11/2018   K 4.3 08/11/2018   CL 105 08/11/2018   CREATININE 0.90 08/11/2018   BUN 18 08/11/2018   CO2 22 08/11/2018   TSH 1.05 06/28/2015     BNP (last 3 results) No results for input(s): BNP in the last 8760 hours.  ProBNP (last 3 results) No results for input(s): PROBNP in the last 8760 hours.   Other Studies Reviewed Today:  CT CARDIACFINDINGS10/2018: Non-cardiac: See separate report from [facility].  Ascending Aorta: Normal size. No calcifications.  Pericardium: Normal.  Coronary  arteries: Normal origin.  IMPRESSION: Coronary calcium score of 0. This was 0 percentile for age and sex matched control.  Ena Dawley   Electronically Signed By: Ena Dawley On: 11/11/2016 07:49   EchoStudy Conclusions7/2018  - Left ventricle: The cavity size was normal. Wall thickness was normal. Systolic function was normal. The estimated ejection fraction was in the range of 60% to 65%. Wall motion was normal; there were no regional wall motion abnormalities. Doppler parameters are consistent with abnormal left ventricular relaxation (grade 1 diastolic dysfunction). - Mitral valve: There was mild regurgitation. - Tricuspid valve: There was trivial regurgitation. - Pulmonary arteries: Systolic  pressure was mildly increased. PA peak pressure: 38 mm Hg (S).  Assessment/Plan:  1. HTN - BP is great here and at home. She is doing great with CV risk factor modification. No changes made today. She will continue to monitor.   2. Diastolic dysfunction - has good BP control and also notes she is restricting her salt more.   3. Calcium score of 0  4. HLD - remains on statin. Rechecking today.   5. Prior lone episode of chest heaviness - has not recurred.   6. COVID-19 Education: The signs and symptoms of COVID-19 were discussed with the patient and how to seek care for testing (follow up with PCP or arrange E-visit).  The importance of social distancing, staying at home, hand hygiene and wearing a mask when out in public were discussed today.  Current medicines are reviewed with the patient today.  The patient does not have concerns regarding medicines other than what has been noted above.  The following changes have been made:  See above.  Labs/ tests ordered today include:    Orders Placed This Encounter  Procedures  . Basic metabolic panel  . Hepatic function panel  . Lipid Profile     Disposition:   FU with me in 6 months.    Patient is agreeable to this plan and will call if any problems develop in the interim.   SignedTruitt Merle, NP  02/02/2019 8:53 AM  [facility] [address] [place], [place]  [address] Phone: [phone] Fax: [phone]

## 2019-02-02 ENCOUNTER — Ambulatory Visit (INDEPENDENT_AMBULATORY_CARE_PROVIDER_SITE_OTHER): Payer: Medicare Other | Admitting: Nurse Practitioner

## 2019-02-02 ENCOUNTER — Other Ambulatory Visit: Payer: Self-pay | Admitting: Nurse Practitioner

## 2019-02-02 ENCOUNTER — Other Ambulatory Visit: Payer: Self-pay

## 2019-02-02 ENCOUNTER — Encounter: Payer: Self-pay | Admitting: Nurse Practitioner

## 2019-02-02 VITALS — BP 130/70 | HR 56 | Ht 65.0 in | Wt 147.8 lb

## 2019-02-02 DIAGNOSIS — E7849 Other hyperlipidemia: Secondary | ICD-10-CM

## 2019-02-02 DIAGNOSIS — I10 Essential (primary) hypertension: Secondary | ICD-10-CM | POA: Diagnosis not present

## 2019-02-02 LAB — BASIC METABOLIC PANEL
BUN/Creatinine Ratio: 19 (ref 12–28)
BUN: 16 mg/dL (ref 8–27)
CO2: 27 mmol/L (ref 20–29)
Calcium: 10.1 mg/dL (ref 8.7–10.3)
Chloride: 101 mmol/L (ref 96–106)
Creatinine, Ser: 0.84 mg/dL (ref 0.57–1.00)
GFR calc Af Amer: 81 mL/min/{1.73_m2} (ref >59)
GFR calc non Af Amer: 71 mL/min/{1.73_m2} (ref >59)
Glucose: 100 mg/dL — ABNORMAL HIGH (ref 65–99)
Potassium: 4.8 mmol/L (ref 3.5–5.2)
Sodium: 143 mmol/L (ref 134–144)

## 2019-02-02 LAB — HEPATIC FUNCTION PANEL
ALT: 15 IU/L (ref 0–32)
AST: 20 IU/L (ref 0–40)
Albumin: 4.8 g/dL (ref 3.8–4.8)
Alkaline Phosphatase: 64 IU/L (ref 39–117)
Bilirubin Total: 0.6 mg/dL (ref 0.0–1.2)
Bilirubin, Direct: 0.16 mg/dL (ref 0.00–0.40)
Total Protein: 6.9 g/dL (ref 6.0–8.5)

## 2019-02-02 LAB — LIPID PANEL
Chol/HDL Ratio: 3 ratio (ref 0.0–4.4)
Cholesterol, Total: 169 mg/dL (ref 100–199)
HDL: 57 mg/dL (ref >39)
LDL Chol Calc (NIH): 85 mg/dL (ref 0–99)
Triglycerides: 157 mg/dL — ABNORMAL HIGH (ref 0–149)
VLDL Cholesterol Cal: 27 mg/dL (ref 5–40)

## 2019-02-02 MED ORDER — METOPROLOL SUCCINATE ER 50 MG PO TB24: ORAL_TABLET | ORAL | 3 refills | Status: DC

## 2019-02-02 MED ORDER — LOSARTAN POTASSIUM-HCTZ 100-12.5 MG PO TABS: 1.0000 | ORAL_TABLET | Freq: Every day | ORAL | 3 refills | Status: DC

## 2019-02-02 MED ORDER — AMLODIPINE BESYLATE 2.5 MG PO TABS: 2.5000 mg | ORAL_TABLET | Freq: Every day | ORAL | 3 refills | Status: DC

## 2019-02-02 MED ORDER — PRAVASTATIN SODIUM 20 MG PO TABS: 20.0000 mg | ORAL_TABLET | Freq: Every evening | ORAL | 3 refills | Status: DC

## 2019-02-02 NOTE — Patient Instructions (Addendum)
After Visit Summary:  We will be checking the following labs today - BMET, lipids and LFTs   Medication Instructions:    Continue with your current medicines.   I refilled everything but the Pravachol today.   If you need a refill on your cardiac medications before your next appointment, please call your pharmacy.     Testing/Procedures To Be Arranged:  N/A  Follow-Up:   See me in 6 months    At [facility], you and your health needs are our priority.  As part of our continuing mission to provide you with exceptional heart care, we have created designated Provider Care Teams.  These Care Teams include your primary Cardiologist (physician) and Advanced Practice Providers (APPs -  Physician Assistants and Nurse Practitioners) who all work together to provide you with the care you need, when you need it.  Special Instructions:  . Stay safe, stay home, wash your hands for at least 20 seconds and wear a mask when out in public.  . It was good to talk with you today.    Call the [place] office at [phone] if you have any questions, problems or concerns.

## 2019-02-03 ENCOUNTER — Other Ambulatory Visit: Payer: Self-pay | Admitting: *Deleted

## 2019-02-03 MED ORDER — PRAVASTATIN SODIUM 20 MG PO TABS: 20.0000 mg | ORAL_TABLET | Freq: Every evening | ORAL | 3 refills | Status: DC

## 2019-02-09 ENCOUNTER — Other Ambulatory Visit: Payer: Self-pay | Admitting: *Deleted

## 2019-02-09 MED ORDER — PRAVASTATIN SODIUM 20 MG PO TABS: 20.0000 mg | ORAL_TABLET | Freq: Every evening | ORAL | 0 refills | Status: DC

## 2019-02-10 ENCOUNTER — Telehealth: Payer: Self-pay | Admitting: Nurse Practitioner

## 2019-02-10 NOTE — Telephone Encounter (Signed)
S/w pt was at pharmacy when talking with pt, pharmacy  did receive request for pravastatin that was sent in yesterday to requested pharmacy in [place].

## 2019-02-10 NOTE — Telephone Encounter (Signed)
Pt calling stating that she needs a refill on pravastatin, sent to the pharmacy [facility] in [place]. Pt would like a call back at [phone]. Please address

## 2019-03-01 ENCOUNTER — Ambulatory Visit: Payer: Medicare Other | Attending: Internal Medicine

## 2019-03-01 DIAGNOSIS — Z23 Encounter for immunization: Secondary | ICD-10-CM

## 2019-03-01 NOTE — Progress Notes (Signed)
   Covid-19 Vaccination Clinic  Name:  Tiffany Graham    MRN: [id number] DOB: 07-30-1950  03/01/2019  [name] was observed post Covid-19 immunization for 15 minutes without incidence. She was provided with Vaccine Information Sheet and instruction to access the V-Safe system.   [name] was instructed to call 911 with any severe reactions post vaccine: Marland Kitchen Difficulty breathing  . Swelling of your face and throat  . A fast heartbeat  . A bad rash all over your body  . Dizziness and weakness    Immunizations Administered    Name Date Dose VIS Date Route   Pfizer COVID-19 Vaccine 03/01/2019  3:50 PM 0.3 mL 01/01/2019 Intramuscular   Manufacturer: [place]   Lot: [id number]   [place]: [address]-7

## 2019-03-04 DIAGNOSIS — H25012 Cortical age-related cataract, left eye: Secondary | ICD-10-CM | POA: Diagnosis not present

## 2019-03-04 DIAGNOSIS — H2512 Age-related nuclear cataract, left eye: Secondary | ICD-10-CM | POA: Diagnosis not present

## 2019-03-04 DIAGNOSIS — H25812 Combined forms of age-related cataract, left eye: Secondary | ICD-10-CM | POA: Diagnosis not present

## 2019-03-04 DIAGNOSIS — H52222 Regular astigmatism, left eye: Secondary | ICD-10-CM | POA: Diagnosis not present

## 2019-03-26 ENCOUNTER — Ambulatory Visit: Payer: Medicare Other | Attending: Internal Medicine

## 2019-03-26 DIAGNOSIS — Z23 Encounter for immunization: Secondary | ICD-10-CM | POA: Insufficient documentation

## 2019-03-26 NOTE — Progress Notes (Signed)
   Covid-19 Vaccination Clinic  Name:  Tiffany Graham    MRN: [id number] DOB: 11-08-1950  03/26/2019  [name] was observed post Covid-19 immunization for 15 minutes without incident. She was provided with Vaccine Information Sheet and instruction to access the V-Safe system.   [name] was instructed to call 911 with any severe reactions post vaccine: Marland Kitchen Difficulty breathing  . Swelling of face and throat  . A fast heartbeat  . A bad rash all over body  . Dizziness and weakness   Immunizations Administered    Name Date Dose VIS Date Route   Pfizer COVID-19 Vaccine 03/26/2019  1:01 PM 0.3 mL 01/01/2019 Intramuscular   Manufacturer: [place]   Lot: [id number]   [place]: [id number]

## 2019-06-14 DIAGNOSIS — H8110 Benign paroxysmal vertigo, unspecified ear: Secondary | ICD-10-CM | POA: Diagnosis not present

## 2019-06-29 ENCOUNTER — Other Ambulatory Visit: Payer: Self-pay | Admitting: Family Medicine

## 2019-06-29 DIAGNOSIS — Z1231 Encounter for screening mammogram for malignant neoplasm of breast: Secondary | ICD-10-CM

## 2019-07-20 NOTE — Progress Notes (Signed)
CARDIOLOGY OFFICE NOTE  Date:  08/03/2019    Tiffany Graham Date of Birth: Aug 13, 1950 Medical Record #[id number]  PCP:  Kinsey Held, DO  Cardiologist:  Gillian Shields   Chief Complaint  Patient presents with  . Follow-up    History of Present Illness: Tiffany Graham is a 69 y.o. female who presents today for a 6 month check. Seen for Dr. Johnsie Cancel.She has primarily followed with me.  She was referred herein [id number] diastolic dysfunction and murmur. She has HTN and HLD. Remote smoker but quit over 30 years ago. Echo from 7/18 showed normal EF and grade 1 DD. She has had prior calcium score of 0.   Seen here in Septemberof 2018- BP poorly controlled. Was unable to have a stress echo due to holding beta blocker and BP was over 546 systolic.I then saw her in November - added Norvasc for BP control and on follow up in the HTN clinic - she was doing ok. Shetakesher Hyzaar and Norvasc at night and her beta blocker in the AM. BP typically better at home than in the office. Last seen back in January and was felt to be doing well.   Comes in today. Here alone. Doing well. Having more issues with vertigo - doing the exercises and taking Antivert. Moving to [place] next spring. Playing pickle ball. "Never home" now - which she is happy with. No chest pain. Breathing is good. Staying very active. BP typically much better at home.   Past Medical History:  Diagnosis Date  . Allergy   . Cataract   . Hyperlipidemia   . Hypertension   . 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 APPENDECTOMY  1991  . CHOLECYSTECTOMY  2004  . TOTAL ABDOMINAL HYSTERECTOMY  1991   with BSO, secondary fibroids  . TUBAL LIGATION  1987   first one 1974, then reversed 1983     Medications: Current Meds  Medication Sig  . aspirin 81 MG tablet Take 81 mg by mouth daily.  . calcium carbonate (OSCAL) 1500 (600 Ca)  MG TABS tablet Take by mouth.  . Glucosamine HCl (GLUCOSAMINE PO) Take 1 tablet by mouth daily.  Marland Kitchen losartan-hydrochlorothiazide (HYZAAR) 100-12.5 MG tablet Take 1 tablet by mouth daily.  . meclizine (ANTIVERT) 25 MG tablet Take 25 mg by mouth 3 (three) times daily as needed.  . metoprolol succinate (TOPROL-XL) 50 MG 24 hr tablet TAKE ONE TABLET BY MOUTH DAILY WITH OR IMMEDIATELY FOLLOWING A MEAL  . Omega-3 Fatty Acids (FISH OIL) 1000 MG CAPS Take 1 capsule by mouth daily.  . pravastatin (PRAVACHOL) 20 MG tablet Take 1 tablet (20 mg total) by mouth every evening.     Allergies: Allergies  Allergen Reactions  . Triprolidine-Pseudoephedrine Hives    Ingredient in Actifed    Social History: The patient  reports that she quit smoking about 38 years ago. She has never used smokeless tobacco. She reports current alcohol use of about 4.0 standard drinks of alcohol per week. She reports that she does not use drugs.   Family History: The patient's family history includes Asthma in an other family member; Atrial fibrillation in her brother, father, and another family member; CAD in her father; Clotting disorder in an other family member; Colon cancer in an other family member; Colon cancer (age of onset: 28) in her father; Coronary artery disease in an other family member; Heart  attack (age of onset: 37) in her mother; Hyperlipidemia in an other family member; Hypertension in an other family member; Osteoporosis in an other family member; Skin cancer in her brother; Thyroid disease in an other family member.   Review of Systems: Please see the history of present illness.   All other systems are reviewed and negative.   Physical Exam: VS:  BP (!) 154/90   Pulse (!) 59   Ht 5' 4"  (1.626 m)   Wt 150 lb 9.6 oz (68.3 kg)   SpO2 97%   BMI 25.85 kg/m  .  BMI Body mass index is 25.85 kg/m.  Wt Readings from Last 3 Encounters:  08/03/19 150 lb 9.6 oz (68.3 kg)  02/02/19 147 lb 12.8 oz (67 kg)    08/11/18 149 lb 12.8 oz (67.9 kg)   BP recheck by me is 150/90  General: Alert and in no acute distress.   No carotid bruits noted.  Cardiac: Regular rate and rhythm. No murmurs, rubs, or gallops. No edema.  Respiratory:  Lungs are clear to auscultation bilaterally with normal work of breathing.  GI: Soft and nontender.  MS: No deformity or atrophy. Gait and ROM intact.  Skin: Warm and dry. Color is normal.  Neuro:  Strength and sensation are intact and no gross focal deficits noted.  Psych: Alert, appropriate and with normal affect.   LABORATORY DATA:  EKG:  EKG is ordered today.  Personally reviewed by me. This demonstrates sinus bradycardia - HR is 59.   Lab Results  Component Value Date   WBC 4.6 08/11/2018   HGB 13.2 08/11/2018   HCT 38.5 08/11/2018   PLT 173 08/11/2018   GLUCOSE 100 (H) 02/02/2019   CHOL 169 02/02/2019   TRIG 157 (H) 02/02/2019   HDL 57 02/02/2019   LDLDIRECT 77.0 07/16/2016   LDLCALC 85 02/02/2019   ALT 15 02/02/2019   AST 20 02/02/2019   NA 143 02/02/2019   K 4.8 02/02/2019   CL 101 02/02/2019   CREATININE 0.84 02/02/2019   BUN 16 02/02/2019   CO2 27 02/02/2019   TSH 1.05 06/28/2015     BNP (last 3 results) No results for input(s): BNP in the last 8760 hours.  ProBNP (last 3 results) No results for input(s): PROBNP in the last 8760 hours.   Other Studies Reviewed Today:  CT CARDIACFINDINGS10/2018: Non-cardiac: See separate report from [facility].  Ascending Aorta: Normal size. No calcifications.  Pericardium: Normal.  Coronary arteries: Normal origin.  IMPRESSION: Coronary calcium score of 0. This was 0 percentile for age and sex matched control.  Ena Dawley   Electronically Signed By: Ena Dawley On: 11/11/2016 07:49   EchoStudy Conclusions7/2018  - Left ventricle: The cavity size was normal. Wall thickness was normal. Systolic function was normal. The estimated  ejection fraction was in the range of 60% to 65%. Wall motion was normal; there were no regional wall motion abnormalities. Doppler parameters are consistent with abnormal left ventricular relaxation (grade 1 diastolic dysfunction). - Mitral valve: There was mild regurgitation. - Tricuspid valve: There was trivial regurgitation. - Pulmonary arteries: Systolic pressure was mildly increased. PA peak pressure: 38 mm Hg (S).   Assessment/Plan:  1. HTN - BP better at home - she has felt like she has some degree of white coat syndrome. She is to continue to monitor at home closely - goal is 130/80 most of the time.   2. Diastolic dysfunction - cautioned about salt use. Reports better BP control  at home.   3. Prior calcium score of 0 - could consider repeating later this year.   4. HLD - lab today - remains on statin therapy.   5. Vertigo - using Meclizine prn - not totally resolved - she is to go back to UC if needed.   Current medicines are reviewed with the patient today.  The patient does not have concerns regarding medicines other than what has been noted above.  The following changes have been made:  See above.  Labs/ tests ordered today include:    Orders Placed This Encounter  Procedures  . Basic metabolic panel  . CBC  . Hepatic function panel  . Lipid panel  . EKG 12-Lead     Disposition:   FU with me in 6 months with fasting labs.   Patient is agreeable to this plan and will call if any problems develop in the interim.   SignedTruitt Merle, NP  08/03/2019 9:24 AM  [facility] [address] [place], [place]  [address] Phone: [phone] Fax: [phone]

## 2019-08-03 ENCOUNTER — Encounter: Payer: Self-pay | Admitting: Nurse Practitioner

## 2019-08-03 ENCOUNTER — Other Ambulatory Visit: Payer: Self-pay

## 2019-08-03 ENCOUNTER — Ambulatory Visit (INDEPENDENT_AMBULATORY_CARE_PROVIDER_SITE_OTHER): Payer: Medicare Other | Admitting: Nurse Practitioner

## 2019-08-03 VITALS — BP 154/90 | HR 59 | Ht 64.0 in | Wt 150.6 lb

## 2019-08-03 DIAGNOSIS — I5189 Other ill-defined heart diseases: Secondary | ICD-10-CM

## 2019-08-03 DIAGNOSIS — I10 Essential (primary) hypertension: Secondary | ICD-10-CM | POA: Diagnosis not present

## 2019-08-03 DIAGNOSIS — E7849 Other hyperlipidemia: Secondary | ICD-10-CM | POA: Diagnosis not present

## 2019-08-03 LAB — HEPATIC FUNCTION PANEL
ALT: 13 IU/L (ref 0–32)
AST: 25 IU/L (ref 0–40)
Albumin: 4.7 g/dL (ref 3.7–4.7)
Alkaline Phosphatase: 73 IU/L (ref 48–121)
Bilirubin Total: 0.5 mg/dL (ref 0.0–1.2)
Bilirubin, Direct: 0.12 mg/dL (ref 0.00–0.40)
Total Protein: 7 g/dL (ref 6.0–8.5)

## 2019-08-03 LAB — BASIC METABOLIC PANEL
BUN/Creatinine Ratio: 22 (ref 12–28)
BUN: 19 mg/dL (ref 8–27)
CO2: 26 mmol/L (ref 20–29)
Calcium: 10.1 mg/dL (ref 8.7–10.3)
Chloride: 101 mmol/L (ref 96–106)
Creatinine, Ser: 0.87 mg/dL (ref 0.57–1.00)
GFR calc Af Amer: 78 mL/min/{1.73_m2} (ref >59)
GFR calc non Af Amer: 67 mL/min/{1.73_m2} (ref >59)
Glucose: 96 mg/dL (ref 65–99)
Potassium: 4.9 mmol/L (ref 3.5–5.2)
Sodium: 138 mmol/L (ref 134–144)

## 2019-08-03 LAB — LIPID PANEL
Chol/HDL Ratio: 3.4 ratio (ref 0.0–4.4)
Cholesterol, Total: 206 mg/dL — ABNORMAL HIGH (ref 100–199)
HDL: 60 mg/dL (ref >39)
LDL Chol Calc (NIH): 104 mg/dL — ABNORMAL HIGH (ref 0–99)
Triglycerides: 249 mg/dL — ABNORMAL HIGH (ref 0–149)
VLDL Cholesterol Cal: 42 mg/dL — ABNORMAL HIGH (ref 5–40)

## 2019-08-03 LAB — CBC
Hematocrit: 40.2 % (ref 34.0–46.6)
Hemoglobin: 13.4 g/dL (ref 11.1–15.9)
MCH: 30.7 pg (ref 26.6–33.0)
MCHC: 33.3 g/dL (ref 31.5–35.7)
MCV: 92 fL (ref 79–97)
Platelets: 166 10*3/uL (ref 150–450)
RBC: 4.37 x10E6/uL (ref 3.77–5.28)
RDW: 12.5 % (ref 11.7–15.4)
WBC: 5.2 10*3/uL (ref 3.4–10.8)

## 2019-08-03 NOTE — Patient Instructions (Addendum)
After Visit Summary:  We will be checking the following labs today - BMET, CBC, HPF, Lipids   Medication Instructions:    Continue with your current medicines.    If you need a refill on your cardiac medications before your next appointment, please call your pharmacy.     Testing/Procedures To Be Arranged:  N/A  Follow-Up:   See me in January with fasting labs    At [facility], you and your health needs are our priority.  As part of our continuing mission to provide you with exceptional heart care, we have created designated Provider Care Teams.  These Care Teams include your primary Cardiologist (physician) and Advanced Practice Providers (APPs -  Physician Assistants and Nurse Practitioners) who all work together to provide you with the care you need, when you need it.  Special Instructions:  . Stay safe, wash your hands for at least 20 seconds and wear a mask when needed.  . It was good to talk with you today.  . Monitor your BP for me at home - goal is less than 130/80 most of the time   Call the [place] office at [phone] if you have any questions, problems or concerns.

## 2019-08-16 ENCOUNTER — Other Ambulatory Visit: Payer: Self-pay

## 2019-08-16 ENCOUNTER — Ambulatory Visit
Admission: RE | Admit: 2019-08-16 | Discharge: 2019-08-16 | Disposition: A | Discharge status: Home / Self Care | Payer: Medicare Other | Source: Ambulatory Visit | Attending: Family Medicine | Admitting: Family Medicine

## 2019-08-16 DIAGNOSIS — Z1231 Encounter for screening mammogram for malignant neoplasm of breast: Secondary | ICD-10-CM

## 2019-10-03 ENCOUNTER — Encounter: Payer: Self-pay | Admitting: Family Medicine

## 2019-10-11 DIAGNOSIS — Z23 Encounter for immunization: Secondary | ICD-10-CM | POA: Diagnosis not present

## 2019-10-18 DIAGNOSIS — L814 Other melanin hyperpigmentation: Secondary | ICD-10-CM | POA: Diagnosis not present

## 2019-10-18 DIAGNOSIS — D1801 Hemangioma of skin and subcutaneous tissue: Secondary | ICD-10-CM | POA: Diagnosis not present

## 2019-10-18 DIAGNOSIS — L821 Other seborrheic keratosis: Secondary | ICD-10-CM | POA: Diagnosis not present

## 2019-10-18 DIAGNOSIS — D225 Melanocytic nevi of trunk: Secondary | ICD-10-CM | POA: Diagnosis not present

## 2019-10-18 DIAGNOSIS — L905 Scar conditions and fibrosis of skin: Secondary | ICD-10-CM | POA: Diagnosis not present

## 2019-10-18 DIAGNOSIS — L570 Actinic keratosis: Secondary | ICD-10-CM | POA: Diagnosis not present

## 2019-10-18 DIAGNOSIS — Z85828 Personal history of other malignant neoplasm of skin: Secondary | ICD-10-CM | POA: Diagnosis not present

## 2019-11-21 DIAGNOSIS — Z23 Encounter for immunization: Secondary | ICD-10-CM | POA: Diagnosis not present

## 2019-12-20 ENCOUNTER — Other Ambulatory Visit: Payer: Self-pay | Admitting: Nurse Practitioner

## 2019-12-22 DIAGNOSIS — M722 Plantar fascial fibromatosis: Secondary | ICD-10-CM | POA: Diagnosis not present

## 2019-12-24 ENCOUNTER — Other Ambulatory Visit: Payer: Self-pay | Admitting: Nurse Practitioner

## 2020-01-11 NOTE — Progress Notes (Deleted)
CARDIOLOGY OFFICE NOTE  Date:  01/11/2020    Tiffany Graham Date of Birth: [birth year] Medical Record #[id number]  PCP:  Noya Held, DO  Cardiologist:  Gillian Shields  No chief complaint on file.   History of Present Illness: Tiffany Graham is a 69 y.o. female who presents today for a follow up visit.  Seen for Dr. Johnsie Cancel. She has primarily followed with me.    She was referred here in 2248 for diastolic dysfunction and murmur. She has HTN and HLD. Remote smoker but quit over 30 years ago. Echo from 7/18 showed normal EF and grade 1 DD. She has had prior calcium score of 0.    Seen here in September of 2018 - BP poorly controlled. Was unable to have a stress echo due to holding beta blocker and BP was over 250 systolic. I then saw her in November - added Norvasc for BP control and on follow up in the HTN clinic - she was doing ok.  She takes her Hyzaar and Norvasc at night and her beta blocker in the AM.  BP typically better at home than in the office. Last seen back in July and she was doing well - planning to move to [facility].   Comes in today. Here with   Past Medical History:  Diagnosis Date  . Allergy   . Cataract   . Hyperlipidemia   . Hypertension   . 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 APPENDECTOMY  1991  . CHOLECYSTECTOMY  2004  . TOTAL ABDOMINAL HYSTERECTOMY  1991   with BSO, secondary fibroids  . TUBAL LIGATION  1987   first one 1974, then reversed 1983     Medications: No outpatient medications have been marked as taking for the 01/25/20 encounter (Appointment) with Burtis Junes, NP.     Allergies: Allergies  Allergen Reactions  . Triprolidine-Pseudoephedrine Hives    Ingredient in Actifed    Social History: The patient  reports that she quit smoking about 38 years ago. She has never used smokeless tobacco. She reports current alcohol use of about 4.0  standard drinks of alcohol per week. She reports that she does not use drugs.   Family History: The patient's ***family history includes Asthma in an other family member; Atrial fibrillation in her brother, father, and another family member; CAD in her father; Clotting disorder in an other family member; Colon cancer in an other family member; Colon cancer (age of onset: 7) in her father; Coronary artery disease in an other family member; Heart attack (age of onset: 39) in her mother; Hyperlipidemia in an other family member; Hypertension in an other family member; Osteoporosis in an other family member; Skin cancer in her brother; Thyroid disease in an other family member.   Review of Systems: Please see the history of present illness.   All other systems are reviewed and negative.   Physical Exam: VS:  There were no vitals taken for this visit. Marland Kitchen  BMI There is no height or weight on file to calculate BMI.  Wt Readings from Last 3 Encounters:  08/03/19 150 lb 9.6 oz (68.3 kg)  02/02/19 147 lb 12.8 oz (67 kg)  08/11/18 149 lb 12.8 oz (67.9 kg)    General: Pleasant. Well developed, well nourished and in no acute distress.   HEENT: Normal.  Neck: Supple, no JVD, carotid bruits, or masses  noted.  Cardiac: ***Regular rate and rhythm. No murmurs, rubs, or gallops. No edema.  Respiratory:  Lungs are clear to auscultation bilaterally with normal work of breathing.  GI: Soft and nontender.  MS: No deformity or atrophy. Gait and ROM intact.  Skin: Warm and dry. Color is normal.  Neuro:  Strength and sensation are intact and no gross focal deficits noted.  Psych: Alert, appropriate and with normal affect.   LABORATORY DATA:  EKG:  EKG {ACTION; IS/IS HCW:[id number]} ordered today.  Personally reviewed by me. This demonstrates ***.  Lab Results  Component Value Date   WBC 5.2 08/03/2019   HGB 13.4 08/03/2019   HCT 40.2 08/03/2019   PLT 166 08/03/2019   GLUCOSE 96 08/03/2019   CHOL 206 (H)  08/03/2019   TRIG 249 (H) 08/03/2019   HDL 60 08/03/2019   LDLDIRECT 77.0 07/16/2016   LDLCALC 104 (H) 08/03/2019   ALT 13 08/03/2019   AST 25 08/03/2019   NA 138 08/03/2019   K 4.9 08/03/2019   CL 101 08/03/2019   CREATININE 0.87 08/03/2019   BUN 19 08/03/2019   CO2 26 08/03/2019   TSH 1.05 06/28/2015     BNP (last 3 results) No results for input(s): BNP in the last 8760 hours.  ProBNP (last 3 results) No results for input(s): PROBNP in the last 8760 hours.   Other Studies Reviewed Today:  CT CARDIAC FINDINGS 10/2016: Non-cardiac: See separate report from [facility].   Ascending Aorta:  Normal size.  No calcifications.   Pericardium: Normal.   Coronary arteries:  Normal origin.   IMPRESSION: Coronary calcium score of 0. This was 0 percentile for age and sex matched control.   Ena Dawley     Electronically Signed   By: Ena Dawley   On: 11/11/2016 07:49      Echo Study Conclusions 07/2016   - Left ventricle: The cavity size was normal. Wall thickness was   normal. Systolic function was normal. The estimated ejection   fraction was in the range of 60% to 65%. Wall motion was normal;   there were no regional wall motion abnormalities. Doppler   parameters are consistent with abnormal left ventricular   relaxation (grade 1 diastolic dysfunction). - Mitral valve: There was mild regurgitation. - Tricuspid valve: There was trivial regurgitation. - Pulmonary arteries: Systolic pressure was mildly increased. PA   peak pressure: 38 mm Hg (S).     Assessment/Plan:   1. HTN - BP better at home - she has felt like she has some degree of white coat syndrome. She is to continue to monitor at home closely - goal is 130/80 most of the time.    2. Diastolic dysfunction - cautioned about salt use. Reports better BP control at home.    3. Prior calcium score of 0 - could consider repeating later this year.    4. HLD - lab today - remains on statin  therapy.    5. Vertigo - using Meclizine prn - not totally resolved - she is to go back to UC if needed.    Current medicines are reviewed with the patient today.  The patient does not have concerns regarding medicines other than what has been noted above.  The following changes have been made:  See above.  Labs/ tests ordered today include:   No orders of the defined types were placed in this encounter.    Disposition:   FU with *** in {gen number 5-17:616073} {Days to  years:10300}.   Patient is agreeable to this plan and will call if any problems develop in the interim.   SignedTruitt Merle, NP  01/11/2020 7:55 AM  [facility] [address] [place], [place]  [address] Phone: [phone] Fax: [phone]

## 2020-01-25 ENCOUNTER — Ambulatory Visit: Payer: Medicare Other | Admitting: Nurse Practitioner

## 2020-02-01 ENCOUNTER — Ambulatory Visit: Payer: Medicare Other | Admitting: Nurse Practitioner

## 2020-02-23 NOTE — Progress Notes (Signed)
CARDIOLOGY OFFICE NOTE  Date:  03/08/2020    [name] Date of Birth: [birth year] Medical Record #[id number]  PCP:  Verginia Held, DO  Cardiologist:  Gillian Shields  Chief Complaint  Patient presents with  . Follow-up    Seen for Dr. Johnsie Cancel    History of Present Illness: MARIANNE GOLIGHTLY is a 70 y.o. female who presents today for a follow up visit. Seen for Dr. Johnsie Cancel. She has primarily followed with me.    She was referred here in 3419 for diastolic dysfunction and murmur. She has HTN and HLD. Remote smoker but quit over 30 years ago. Echo from 7/18 showed normal EF and grade 1 DD. She has had prior calcium score of 0.    Seen here in September of 2018 - BP poorly controlled. Was unable to have a stress echo due to holding beta blocker and BP was over 622 systolic. I then saw her in November - added Norvasc for BP control and on follow up in the HTN clinic - she was doing ok.  She takes her Hyzaar and Norvasc at night and her beta blocker in the AM.  BP typically better at home than in the office. Last seen back in July - had had some issues with vertigo but otherwise doing ok. Planning on moving to [facility] this spring. Likes pickle ball. BP typically better at home.   Comes in today. Here alone. She is doing well. Moving to [place] in May. Trying to get established with provider there. Doing well. No chest pain. BP diary from home looks great. BP ok at home - little higher here - she has gained a few pounds - enjoying traveling. Husband sold his franchise. She is very happy. Would like her PPI refilled.   Past Medical History:  Diagnosis Date  . Allergy   . Cataract   . Hyperlipidemia   . Hypertension   . 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 APPENDECTOMY  1991  . CHOLECYSTECTOMY  2004  . TOTAL ABDOMINAL HYSTERECTOMY  1991   with BSO, secondary fibroids  . TUBAL LIGATION   1987   first one 1974, then reversed 1983     Medications: Current Meds  Medication Sig  . amLODipine (NORVASC) 2.5 MG tablet TAKE 1 TABLET DAILY  . aspirin 81 MG tablet Take 81 mg by mouth daily.  . calcium carbonate (OSCAL) 1500 (600 Ca) MG TABS tablet Take 1,500 mg by mouth daily with breakfast.  . Glucosamine HCl (GLUCOSAMINE PO) Take 1 tablet by mouth daily.  Marland Kitchen losartan-hydrochlorothiazide (HYZAAR) 100-12.5 MG tablet TAKE 1 TABLET DAILY  . meclizine (ANTIVERT) 25 MG tablet Take 25 mg by mouth 3 (three) times daily as needed.  . metoprolol succinate (TOPROL-XL) 50 MG 24 hr tablet TAKE 1 TABLET DAILY WITH ORIMMEDIATELY FOLLOWING A    MEAL  . Omega-3 Fatty Acids (FISH OIL) 1000 MG CAPS Take 1 capsule by mouth daily.  Marland Kitchen omeprazole (PRILOSEC) 20 MG capsule Take 1 capsule (20 mg total) by mouth daily.  . pravastatin (PRAVACHOL) 20 MG tablet TAKE 1 TABLET EVERY EVENING     Allergies: Allergies  Allergen Reactions  . Triprolidine-Pseudoephedrine Hives    Ingredient in Actifed    Social History: The patient  reports that she quit smoking about 39 years ago. She has never used smokeless tobacco. She reports current alcohol use of about  4.0 standard drinks of alcohol per week. She reports that she does not use drugs.   Family History: The patient's family history includes Asthma in an other family member; Atrial fibrillation in her brother, father, and another family member; CAD in her father; Clotting disorder in an other family member; Colon cancer in an other family member; Colon cancer (age of onset: 56) in her father; Coronary artery disease in an other family member; Heart attack (age of onset: 36) in her mother; Hyperlipidemia in an other family member; Hypertension in an other family member; Osteoporosis in an other family member; Skin cancer in her brother; Thyroid disease in an other family member.   Review of Systems: Please see the history of present illness.   All other  systems are reviewed and negative.   Physical Exam: VS:  BP (!) 144/70   Pulse 67   Ht 5' 4"  (1.626 m)   Wt 152 lb (68.9 kg)   SpO2 97%   BMI 26.09 kg/m  .  BMI Body mass index is 26.09 kg/m.  Wt Readings from Last 3 Encounters:  03/08/20 152 lb (68.9 kg)  08/03/19 150 lb 9.6 oz (68.3 kg)  02/02/19 147 lb 12.8 oz (67 kg)    General: Pleasant. Alert and in no acute distress.   Cardiac: Regular rate and rhythm. No murmurs, rubs, or gallops. No edema.  Respiratory:  Lungs are clear to auscultation bilaterally with normal work of breathing.  GI: Soft and nontender.  MS: No deformity or atrophy. Gait and ROM intact.  Skin: Warm and dry. Color is normal.  Neuro:  Strength and sensation are intact and no gross focal deficits noted.  Psych: Alert, appropriate and with normal affect.   LABORATORY DATA:  EKG:  EKG is not ordered today.   Lab Results  Component Value Date   WBC 5.2 08/03/2019   HGB 13.4 08/03/2019   HCT 40.2 08/03/2019   PLT 166 08/03/2019   GLUCOSE 96 08/03/2019   CHOL 206 (H) 08/03/2019   TRIG 249 (H) 08/03/2019   HDL 60 08/03/2019   LDLDIRECT 77.0 07/16/2016   LDLCALC 104 (H) 08/03/2019   ALT 13 08/03/2019   AST 25 08/03/2019   NA 138 08/03/2019   K 4.9 08/03/2019   CL 101 08/03/2019   CREATININE 0.87 08/03/2019   BUN 19 08/03/2019   CO2 26 08/03/2019   TSH 1.05 06/28/2015     BNP (last 3 results) No results for input(s): BNP in the last 8760 hours.  ProBNP (last 3 results) No results for input(s): PROBNP in the last 8760 hours.   Other Studies Reviewed Today:  CT CARDIAC FINDINGS 10/2016: Non-cardiac: See separate report from [facility].   Ascending Aorta:  Normal size.  No calcifications.   Pericardium: Normal.   Coronary arteries:  Normal origin.   IMPRESSION: Coronary calcium score of 0. This was 0 percentile for age and sex matched control.   Ena Dawley     Electronically Signed   By: Ena Dawley    On: 11/11/2016 07:49      Echo Study Conclusions 07/2016   - Left ventricle: The cavity size was normal. Wall thickness was   normal. Systolic function was normal. The estimated ejection   fraction was in the range of 60% to 65%. Wall motion was normal;   there were no regional wall motion abnormalities. Doppler   parameters are consistent with abnormal left ventricular   relaxation (grade 1 diastolic dysfunction). - Mitral  valve: There was mild regurgitation. - Tricuspid valve: There was trivial regurgitation. - Pulmonary arteries: Systolic pressure was mildly increased. PA   peak pressure: 38 mm Hg (S).     Assessment/Plan:  1. HTN - has good control at home - no changes made - cautioned her about salt/weight gain.   2. Diastolic  dysfunction - has good BP control at home. Needs salt restriction.   3. HLD - lab today - on statin.   4. GERD - PPI refilled.    Current medicines are reviewed with the patient today.  The patient does not have concerns regarding medicines other than what has been noted above.  The following changes have been made:  See above.  Labs/ tests ordered today include:    Orders Placed This Encounter  Procedures  . Basic metabolic panel  . CBC  . Hepatic function panel  . Lipid panel     Disposition:   FU with [place] prn. Labs today. She is aware that I am leaving. She is trying to establish with a provider in [place]. She may be back up this way periodically and if so, will call as needed.    Patient is agreeable to this plan and will call if any problems develop in the interim.   SignedTruitt Merle, NP  03/08/2020 9:20 AM  [facility] [address] [place], [place]  [address] Phone: [phone] Fax: [phone]

## 2020-03-06 DIAGNOSIS — M79671 Pain in right foot: Secondary | ICD-10-CM | POA: Diagnosis not present

## 2020-03-06 DIAGNOSIS — M722 Plantar fascial fibromatosis: Secondary | ICD-10-CM | POA: Diagnosis not present

## 2020-03-08 ENCOUNTER — Other Ambulatory Visit: Payer: Self-pay

## 2020-03-08 ENCOUNTER — Ambulatory Visit (INDEPENDENT_AMBULATORY_CARE_PROVIDER_SITE_OTHER): Payer: Medicare Other | Admitting: Nurse Practitioner

## 2020-03-08 ENCOUNTER — Encounter: Payer: Self-pay | Admitting: Nurse Practitioner

## 2020-03-08 VITALS — BP 144/70 | HR 67 | Ht 64.0 in | Wt 152.0 lb

## 2020-03-08 DIAGNOSIS — E7849 Other hyperlipidemia: Secondary | ICD-10-CM | POA: Diagnosis not present

## 2020-03-08 DIAGNOSIS — I10 Essential (primary) hypertension: Secondary | ICD-10-CM

## 2020-03-08 DIAGNOSIS — I5189 Other ill-defined heart diseases: Secondary | ICD-10-CM | POA: Diagnosis not present

## 2020-03-08 LAB — LIPID PANEL
Chol/HDL Ratio: 2.7 ratio (ref 0.0–4.4)
Cholesterol, Total: 208 mg/dL — ABNORMAL HIGH (ref 100–199)
HDL: 78 mg/dL (ref >39)
LDL Chol Calc (NIH): 105 mg/dL — ABNORMAL HIGH (ref 0–99)
Triglycerides: 144 mg/dL (ref 0–149)
VLDL Cholesterol Cal: 25 mg/dL (ref 5–40)

## 2020-03-08 LAB — BASIC METABOLIC PANEL
BUN/Creatinine Ratio: 26 (ref 12–28)
BUN: 22 mg/dL (ref 8–27)
CO2: 25 mmol/L (ref 20–29)
Calcium: 10.1 mg/dL (ref 8.7–10.3)
Chloride: 103 mmol/L (ref 96–106)
Creatinine, Ser: 0.86 mg/dL (ref 0.57–1.00)
GFR calc Af Amer: 79 mL/min/{1.73_m2} (ref >59)
GFR calc non Af Amer: 68 mL/min/{1.73_m2} (ref >59)
Glucose: 87 mg/dL (ref 65–99)
Potassium: 4.4 mmol/L (ref 3.5–5.2)
Sodium: 144 mmol/L (ref 134–144)

## 2020-03-08 LAB — CBC
Hematocrit: 41 % (ref 34.0–46.6)
Hemoglobin: 14.1 g/dL (ref 11.1–15.9)
MCH: 31.6 pg (ref 26.6–33.0)
MCHC: 34.4 g/dL (ref 31.5–35.7)
MCV: 92 fL (ref 79–97)
Platelets: 211 10*3/uL (ref 150–450)
RBC: 4.46 x10E6/uL (ref 3.77–5.28)
RDW: 12.7 % (ref 11.7–15.4)
WBC: 8.9 10*3/uL (ref 3.4–10.8)

## 2020-03-08 LAB — HEPATIC FUNCTION PANEL
ALT: 13 IU/L (ref 0–32)
AST: 17 IU/L (ref 0–40)
Albumin: 5 g/dL — ABNORMAL HIGH (ref 3.7–4.7)
Alkaline Phosphatase: 63 IU/L (ref 44–121)
Bilirubin Total: 0.6 mg/dL (ref 0.0–1.2)
Bilirubin, Direct: 0.14 mg/dL (ref 0.00–0.40)
Total Protein: 7.5 g/dL (ref 6.0–8.5)

## 2020-03-08 MED ORDER — OMEPRAZOLE 20 MG PO CPDR: 20.0000 mg | DELAYED_RELEASE_CAPSULE | Freq: Every day | ORAL | 3 refills | Status: AC

## 2020-03-08 NOTE — Patient Instructions (Addendum)
After Visit Summary:  We will be checking the following labs today - BMET, CBC, HPF, lipids   Medication Instructions:    Continue with your current medicines.    If you need a refill on your cardiac medications before your next appointment, please call your pharmacy.     Testing/Procedures To Be Arranged:  N/A  Follow-Up:   See [place] back as needed.     At [facility], you and your health needs are our priority.  As part of our continuing mission to provide you with exceptional heart care, we have created designated Provider Care Teams.  These Care Teams include your primary Cardiologist (physician) and Advanced Practice Providers (APPs -  Physician Assistants and Nurse Practitioners) who all work together to provide you with the care you need, when you need it.  Special Instructions:  . Stay safe, wash your hands for at least 20 seconds and wear a mask when needed.  . It was good to talk with you today.    Call the [place] office at [phone] if you have any questions, problems or concerns.

## 2020-04-11 DIAGNOSIS — H26492 Other secondary cataract, left eye: Secondary | ICD-10-CM | POA: Diagnosis not present

## 2020-04-11 DIAGNOSIS — H31003 Unspecified chorioretinal scars, bilateral: Secondary | ICD-10-CM | POA: Diagnosis not present

## 2020-04-11 DIAGNOSIS — H43813 Vitreous degeneration, bilateral: Secondary | ICD-10-CM | POA: Diagnosis not present

## 2020-06-16 DIAGNOSIS — H9202 Otalgia, left ear: Secondary | ICD-10-CM | POA: Diagnosis not present

## 2020-08-07 ENCOUNTER — Emergency Department (HOSPITAL_COMMUNITY)
Admission: EM | Admit: 2020-08-07 | Discharge: 2020-08-07 | Disposition: A | Discharge status: Home / Self Care | Payer: Medicare Other | Attending: Emergency Medicine | Admitting: Emergency Medicine

## 2020-08-07 ENCOUNTER — Other Ambulatory Visit: Payer: Self-pay

## 2020-08-07 ENCOUNTER — Encounter (HOSPITAL_COMMUNITY): Payer: Self-pay | Admitting: Emergency Medicine

## 2020-08-07 ENCOUNTER — Emergency Department (HOSPITAL_COMMUNITY): Payer: Medicare Other

## 2020-08-07 DIAGNOSIS — I10 Essential (primary) hypertension: Secondary | ICD-10-CM | POA: Diagnosis not present

## 2020-08-07 DIAGNOSIS — R1010 Upper abdominal pain, unspecified: Secondary | ICD-10-CM | POA: Diagnosis present

## 2020-08-07 DIAGNOSIS — R1013 Epigastric pain: Secondary | ICD-10-CM | POA: Insufficient documentation

## 2020-08-07 DIAGNOSIS — Z7982 Long term (current) use of aspirin: Secondary | ICD-10-CM | POA: Diagnosis not present

## 2020-08-07 DIAGNOSIS — Z79899 Other long term (current) drug therapy: Secondary | ICD-10-CM | POA: Diagnosis not present

## 2020-08-07 DIAGNOSIS — Z87891 Personal history of nicotine dependence: Secondary | ICD-10-CM | POA: Insufficient documentation

## 2020-08-07 DIAGNOSIS — N179 Acute kidney failure, unspecified: Secondary | ICD-10-CM | POA: Insufficient documentation

## 2020-08-07 LAB — URINALYSIS, ROUTINE W REFLEX MICROSCOPIC
Bilirubin Urine: NEGATIVE
Glucose, UA: NEGATIVE mg/dL
Hgb urine dipstick: NEGATIVE
Ketones, ur: NEGATIVE mg/dL
Nitrite: NEGATIVE
Protein, ur: NEGATIVE mg/dL
Specific Gravity, Urine: 1.013 (ref 1.005–1.030)
pH: 5 (ref 5.0–8.0)

## 2020-08-07 LAB — COMPREHENSIVE METABOLIC PANEL
ALT: 38 U/L (ref 0–44)
AST: 42 U/L — ABNORMAL HIGH (ref 15–41)
Albumin: 4.1 g/dL (ref 3.5–5.0)
Alkaline Phosphatase: 79 U/L (ref 38–126)
Anion gap: 10 (ref 5–15)
BUN: 20 mg/dL (ref 8–23)
CO2: 27 mmol/L (ref 22–32)
Calcium: 9.2 mg/dL (ref 8.9–10.3)
Chloride: 99 mmol/L (ref 98–111)
Creatinine, Ser: 1.66 mg/dL — ABNORMAL HIGH (ref 0.44–1.00)
GFR, Estimated: 33 mL/min — ABNORMAL LOW (ref >60)
Glucose, Bld: 112 mg/dL — ABNORMAL HIGH (ref 70–99)
Potassium: 3.6 mmol/L (ref 3.5–5.1)
Sodium: 136 mmol/L (ref 135–145)
Total Bilirubin: 0.9 mg/dL (ref 0.3–1.2)
Total Protein: 7 g/dL (ref 6.5–8.1)

## 2020-08-07 LAB — CBC
HCT: 37.9 % (ref 36.0–46.0)
Hemoglobin: 13 g/dL (ref 12.0–15.0)
MCH: 31.3 pg (ref 26.0–34.0)
MCHC: 34.3 g/dL (ref 30.0–36.0)
MCV: 91.3 fL (ref 80.0–100.0)
Platelets: 233 10*3/uL (ref 150–400)
RBC: 4.15 MIL/uL (ref 3.87–5.11)
RDW: 11.7 % (ref 11.5–15.5)
WBC: 7.2 10*3/uL (ref 4.0–10.5)
nRBC: 0 % (ref 0.0–0.2)

## 2020-08-07 LAB — LIPASE, BLOOD: Lipase: 34 U/L (ref 11–51)

## 2020-08-07 MED ORDER — SODIUM CHLORIDE 0.9 % IV BOLUS
1000.0000 mL | Freq: Once | INTRAVENOUS | Status: AC
  Administered 2020-08-07: 1000 mL via INTRAVENOUS

## 2020-08-07 MED ORDER — ALUM & MAG HYDROXIDE-SIMETH 200-200-20 MG/5ML PO SUSP: 30.0000 mL | Freq: Once | ORAL | Status: DC

## 2020-08-07 MED ORDER — OMEPRAZOLE 20 MG PO CPDR: 20.0000 mg | DELAYED_RELEASE_CAPSULE | Freq: Every day | ORAL | 0 refills | Status: AC

## 2020-08-07 MED ORDER — LIDOCAINE VISCOUS HCL 2 % MT SOLN: 15.0000 mL | Freq: Once | OROMUCOSAL | Status: DC

## 2020-08-07 MED ORDER — SUCRALFATE 1 GM/10ML PO SUSP: 1.0000 g | Freq: Three times a day (TID) | ORAL | 1 refills | Status: AC

## 2020-08-07 MED ORDER — IOHEXOL 300 MG/ML  SOLN
80.0000 mL | Freq: Once | INTRAMUSCULAR | Status: AC | PRN
  Administered 2020-08-07: 80 mL via INTRAVENOUS

## 2020-08-07 MED ORDER — FAMOTIDINE 20 MG PO TABS: 20.0000 mg | ORAL_TABLET | Freq: Two times a day (BID) | ORAL | 0 refills | Status: AC

## 2020-08-07 NOTE — ED Provider Notes (Signed)
[facility] EMERGENCY DEPARTMENT Provider Note   CSN: [id number] Arrival date & time: 08/07/20  4174     History Chief Complaint  Patient presents with   Abdominal Pain    Tiffany Graham is a 70 y.o. female.  Patient presents the emergency department today for evaluation of upper abdominal pain.  Patient is on as needed omeprazole for GERD and has a history of cholecystectomy.  She states over the past week to 10 days she has been having upper abdominal pain.  This started with a viral illness which has resolved.  Since that time she has had pain in her upper abdomen that is much worse when she eats or drinks.  She is able to drink tea without pain, however any solids causes significant pain.  She denies diarrhea or constipation.  No chest pain or shortness of breath.  She has been taking PPI without much improvement.  No urine symptoms.  She currently resides in [place] but used to live in [place] and states that her daughter lives in the area.  The onset of this condition was acute. The course is constant. Aggravating factors: none. Alleviating factors: none.        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/29/2016    Patient Active Problem List   Diagnosis Date Noted   Wellness examination 06/28/2015   Arm pain 09/10/2011   Abdominal pain, acute, epigastric 05/15/2010   Other chest pain 05/15/2010   CONTACT DERMATITIS&OTHER ECZEMA DUE UNSPEC CAUSE 09/19/2009   Hyperlipidemia LDL goal <100 06/08/2009   OSTEOPOROSIS 03/16/2009   URI 09/19/2008   CONTACT DERMATITIS&OTHER ECZEMA DUE TO PLANTS 06/07/2008   UTI 11/23/2007   SINUSITIS- ACUTE-NOS 09/07/2007   Essential hypertension 01/28/2007    Past Surgical History:  Procedure Laterality Date   APPENDECTOMY  1991   CHOLECYSTECTOMY  2004   TOTAL ABDOMINAL HYSTERECTOMY  1991   with BSO, secondary  fibroids   [place]   first one 1974, then reversed 1983     OB History   No obstetric history on file.     Family History  Problem Relation Age of Onset   Colon cancer Father 73   CAD Father    Atrial fibrillation Father    Colon cancer Other    Coronary artery disease Other    Asthma Other    Hyperlipidemia Other    Hypertension Other    Osteoporosis Other    Thyroid disease Other    Clotting disorder Other        thromboembolism   Heart attack Mother 71   Atrial fibrillation Brother    Skin cancer Brother    Atrial fibrillation Other    Breast cancer Neg Hx    Colon polyps Neg Hx    Esophageal cancer Neg Hx    Rectal cancer Neg Hx    Stomach cancer Neg Hx     Social History   Tobacco Use   Smoking status: Former    Types: Cigarettes    Quit date: 01/21/1981    Years since quitting: 39.5   Smokeless tobacco: Never  Substance Use Topics   Alcohol use: Yes    Alcohol/week: 4.0 standard drinks    Types: 4 Glasses of wine per week    Comment: 1 bottle of wine per weekend   Drug use: No  Home Medications Prior to Admission medications   Medication Sig Start Date End Date Taking? Authorizing Provider  famotidine (PEPCID) 20 MG tablet Take 1 tablet (20 mg total) by mouth 2 (two) times daily. 08/07/20  Yes Carlisle Cater, PA-C  omeprazole (PRILOSEC) 20 MG capsule Take 1 capsule (20 mg total) by mouth daily. 08/07/20  Yes Carlisle Cater, PA-C  sucralfate (CARAFATE) 1 GM/10ML suspension Take 10 mLs (1 g total) by mouth 4 (four) times daily -  with meals and at bedtime. 08/07/20  Yes Carlisle Cater, PA-C  amLODipine (NORVASC) 2.5 MG tablet TAKE 1 TABLET DAILY 12/24/19   Burtis Junes, NP  aspirin 81 MG tablet Take 81 mg by mouth daily.    [provider]  calcium carbonate (OSCAL) 1500 (600 Ca) MG TABS tablet Take 1,500 mg by mouth daily with breakfast.    [provider]  Glucosamine HCl (GLUCOSAMINE PO) Take 1 tablet by mouth daily.     [provider]  losartan-hydrochlorothiazide [facility]) 100-12.5 MG tablet TAKE 1 TABLET DAILY 12/21/19   Burtis Junes, NP  meclizine (ANTIVERT) 25 MG tablet Take 25 mg by mouth 3 (three) times daily as needed. 06/14/19   [provider]  metoprolol succinate (TOPROL-XL) 50 MG 24 hr tablet TAKE 1 TABLET DAILY WITH ORIMMEDIATELY FOLLOWING A    MEAL 12/21/19   Burtis Junes, NP  Omega-3 Fatty Acids (FISH OIL) 1000 MG CAPS Take 1 capsule by mouth daily.    [provider]  omeprazole (PRILOSEC) 20 MG capsule Take 1 capsule (20 mg total) by mouth daily. 03/08/20   Burtis Junes, NP  pravastatin (PRAVACHOL) 20 MG tablet TAKE 1 TABLET EVERY EVENING 12/21/19   Burtis Junes, NP    Allergies    Triprolidine-pseudoephedrine  Review of Systems   Review of Systems  Constitutional:  Negative for fever.  HENT:  Negative for rhinorrhea and sore throat.   Eyes:  Negative for redness.  Respiratory:  Negative for cough.   Cardiovascular:  Negative for chest pain.  Gastrointestinal:  Positive for abdominal pain and nausea. Negative for blood in stool, constipation, diarrhea and vomiting.  Genitourinary:  Negative for dysuria, frequency, hematuria and urgency.  Musculoskeletal:  Negative for myalgias.  Skin:  Negative for rash.  Neurological:  Negative for headaches.   Physical Exam Updated Vital Signs BP 122/68 (BP Location: Right Arm)   Pulse 66   Temp 98.1 F (36.7 C) (Oral)   Resp 18   Ht 5' 5"  (1.651 m)   Wt 67.1 kg   SpO2 99%   BMI 24.63 kg/m   Physical Exam Vitals and nursing note reviewed.  Constitutional:      General: She is not in acute distress.    Appearance: She is well-developed.  HENT:     Head: Normocephalic and atraumatic.     Right Ear: External ear normal.     Left Ear: External ear normal.     Nose: Nose normal.  Eyes:     Conjunctiva/sclera: Conjunctivae normal.  Cardiovascular:     Rate and Rhythm: Normal rate and regular  rhythm.     Heart sounds: No murmur heard. Pulmonary:     Effort: No respiratory distress.     Breath sounds: No wheezing, rhonchi or rales.  Abdominal:     Palpations: Abdomen is soft.     Tenderness: There is abdominal tenderness (Mild epigastric, no rebound or guarding) in the epigastric area. There is no guarding or rebound.  Musculoskeletal:     Cervical back: Normal range of motion and neck supple.     Right lower leg: No edema.     Left lower leg: No edema.  Skin:    General: Skin is warm and dry.     Findings: No rash.  Neurological:     General: No focal deficit present.     Mental Status: She is alert. Mental status is at baseline.     Motor: No weakness.  Psychiatric:        Mood and Affect: Mood normal.    ED Results / Procedures / Treatments   Labs (all labs ordered are listed, but only abnormal results are displayed) Labs Reviewed  COMPREHENSIVE METABOLIC PANEL - Abnormal; Notable for the following components:      Result Value   Glucose, Bld 112 (*)    Creatinine, Ser 1.66 (*)    AST 42 (*)    GFR, Estimated 33 (*)    All other components within normal limits  URINALYSIS, ROUTINE W REFLEX MICROSCOPIC - Abnormal; Notable for the following components:   APPearance HAZY (*)    Leukocytes,Ua SMALL (*)    Bacteria, UA RARE (*)    All other components within normal limits  LIPASE, BLOOD  CBC    EKG None  Radiology CT ABDOMEN PELVIS W CONTRAST  Result Date: 08/07/2020 CLINICAL DATA:  Left upper quadrant abdominal pain with nausea EXAM: CT ABDOMEN AND PELVIS WITH CONTRAST TECHNIQUE: Multidetector CT imaging of the abdomen and pelvis was performed using the standard protocol following bolus administration of intravenous contrast. CONTRAST:  14m OMNIPAQUE IOHEXOL 300 MG/ML  SOLN COMPARISON:  None. FINDINGS: Lower chest: No acute abnormality. Hepatobiliary: No focal liver abnormality is seen. Status post cholecystectomy. No biliary dilatation. Pancreas:  Unremarkable. No pancreatic ductal dilatation or surrounding inflammatory changes. Spleen: Normal in size without focal abnormality. Adrenals/Urinary Tract: Unremarkable adrenal glands. Kidneys enhance symmetrically without focal lesion, stone, or hydronephrosis. Ureters are nondilated. Urinary bladder appears unremarkable. Stomach/Bowel: Stomach is within normal limits. Appendix not visualized. Scattered colonic diverticulosis, most numerous within the sigmoid colon. No evidence of bowel wall thickening, distention, or inflammatory changes. Vascular/Lymphatic: Scattered aortoiliac atherosclerotic calcifications without aneurysm. No abdominopelvic lymphadenopathy. Reproductive: Status post hysterectomy. No adnexal masses. Other: No free fluid. No abdominopelvic fluid collection. No pneumoperitoneum. No abdominal wall hernia. Musculoskeletal: No acute or significant osseous findings. IMPRESSION: 1. No acute abdominopelvic findings. 2. Colonic diverticulosis without evidence of acute diverticulitis. 3. Aortic atherosclerosis (ICD10-I70.0). Electronically Signed   By: NDavina PokeD.O.   On: 08/07/2020 14:44    Procedures Procedures   Medications Ordered in ED Medications  alum & mag hydroxide-simeth (MAALOX/MYLANTA) 200-200-20 MG/5ML suspension 30 mL (has no administration in time range)    And  lidocaine (XYLOCAINE) 2 % viscous mouth solution 15 mL (has no administration in time range)  sodium chloride 0.9 % bolus 1,000 mL (1,000 mLs Intravenous New Bag/Given 08/07/20 1303)  iohexol (OMNIPAQUE) 300 MG/ML solution 80 mL (80 mLs Intravenous Contrast Given 08/07/20 1413)    ED Course  I have reviewed the triage vital signs and the nursing notes.  Pertinent labs & imaging results that were available during my care of the patient were reviewed by me and considered in my medical decision making (see chart for details).  Patient seen and examined.  Labs to this point are reassuring.  Will obtain CT  imaging to evaluate for colitis, diverticulitis, bowel perforation.  Patient in agreement.  She has GI  follow-up in [place].  Vital signs reviewed and are as follows: BP 112/86 (BP Location: Left Arm)   Pulse 69   Temp 98.2 F (36.8 C) (Oral)   Resp 18   Ht 5' 5"  (1.651 m)   Wt 67.1 kg   SpO2 100%   BMI 24.63 kg/m   CT personally reviewed.  Patient notified in regards to incidental findings.  Will give GI cocktail.  Patient is comfortable with discharge to home.  She is on a PPI which she is encouraged to continue daily until GI follow-up.  Will add twice daily Pepcid and Carafate.  Encouraged to avoid foods that make her symptoms worse.  Patient urged to return with worsening symptoms or other concerns. Patient verbalized understanding and agrees with plan.     MDM Rules/Calculators/A&P                          Patient with abdominal pain, worse with eating, likely upper GI in nature.  Consider peptic ulcer disease, gastritis, duodenitis. Vitals are stable, no fever. Labs demonstrate mild AKI, likely elevated from baseline, likely related to diminished oral intake over the past 10 days.  Patient was given IV hydration.  She is tolerating p.o.'s now.  I do not feel that she requires admission for IV hydration given her well appearance.. Imaging reassuring. Lungs are clear and no signs suggestive of PNA. Low concern for appendicitis, cholecystitis, pancreatitis, ruptured viscus, UTI, kidney stone, aortic dissection, aortic aneurysm or other emergent abdominal etiology. Supportive therapy indicated with return if symptoms worsen.   Final Clinical Impression(s) / ED Diagnoses Final diagnoses:  Epigastric abdominal pain  Acute kidney injury [facility])    Rx / DC Orders ED Discharge Orders          Ordered    omeprazole (PRILOSEC) 20 MG capsule  Daily        08/07/20 1525    famotidine (PEPCID) 20 MG tablet  2 times daily        08/07/20 1525    sucralfate (CARAFATE) 1 GM/10ML suspension   3 times daily with meals & bedtime        08/07/20 1525             Carlisle Cater, PA-C 08/07/20 [place], Alvin Critchley, DO 08/08/20 1723

## 2020-08-07 NOTE — Discharge Instructions (Signed)
Please read and follow all provided instructions.  Your diagnoses today include:  1. Epigastric abdominal pain   2. Acute kidney injury ([place])     Tests performed today include: Blood cell counts and platelets Kidney and liver function tests -your kidney function is weak today, possibly related to dehydration.  Continue to drink plenty of fluids and have this test rechecked by your doctor. Pancreas function test (called lipase) Urine test to look for infection CT scan of your abdomen and pelvis -fortunately does not show any acute findings today.  You do have atherosclerosis of the aorta and diverticulosis incidentally noted. Vital signs. See below for your results today.   Medications prescribed:   Take any prescribed medications only as directed.  Home care instructions:  Follow any educational materials contained in this packet.  Follow-up instructions: Please follow-up with your primary care provider in the next 2 days for further evaluation of your symptoms.    Return instructions:  SEEK IMMEDIATE MEDICAL ATTENTION IF: The pain does not go away or becomes severe  A temperature above 101F develops  Repeated vomiting occurs (multiple episodes)  The pain becomes localized to portions of the abdomen. The right side could possibly be appendicitis. In an adult, the left lower portion of the abdomen could be colitis or diverticulitis.  Blood is being passed in stools or vomit (bright red or black tarry stools)  You develop chest pain, difficulty breathing, dizziness or fainting, or become confused, poorly responsive, or inconsolable (young children) If you have any other emergent concerns regarding your health  Additional Information: Abdominal (belly) pain can be caused by many things. Your caregiver performed an examination and possibly ordered blood/urine tests and imaging (CT scan, x-rays, ultrasound). Many cases can be observed and treated at home after initial evaluation in the  emergency department. Even though you are being discharged home, abdominal pain can be unpredictable. Therefore, you need a repeated exam if your pain does not resolve, returns, or worsens. Most patients with abdominal pain don't have to be admitted to the hospital or have surgery, but serious problems like appendicitis and gallbladder attacks can start out as nonspecific pain. Many abdominal conditions cannot be diagnosed in one visit, so follow-up evaluations are very important.  Your vital signs today were: BP 122/68 (BP Location: Right Arm)   Pulse 66   Temp 98.1 F (36.7 C) (Oral)   Resp 18   Ht 5' 5"  (1.651 m)   Wt 67.1 kg   SpO2 99%   BMI 24.63 kg/m  If your blood pressure (bp) was elevated above 135/85 this visit, please have this repeated by your doctor within one month. --------------

## 2020-08-07 NOTE — ED Triage Notes (Signed)
Pt states she has been having severe upper abd pains for 10 days. Pt states it worsens when she eats. She has bad GERD. Pt states she had her gallbladder removed in 2004.

## 2020-08-31 ENCOUNTER — Telehealth: Payer: Self-pay | Admitting: Cardiovascular Disease

## 2020-08-31 MED ORDER — METOPROLOL SUCCINATE ER 25 MG PO TB24: ORAL_TABLET | ORAL | 3 refills | Status: AC

## 2020-08-31 NOTE — Telephone Encounter (Signed)
Called patient back about her message. Patient stated that her BP has been running low. Patient stated it started when she got sick in early July, patient stated it started out with flu like symptoms and turned into GI issues, patient stated she is having testing done for GI issues and things have gotten better with medication she is taking. During all this patient has lost 10 pounds and is not eating well. Encouraged patient to check her BP before taking her medications and if it is low to hold her medications at that time. Patient is currently taking Hyzaar 100-12.5 mg in the AM, and Toprol 50  and Amlodipine 2.5 mg in the PM. Patient stated her HR is between 60 to 70. Encouraged patient to increase her fluid intake and eat a small salty snack when BP is low. Patient also stated she has moved to [place], [place] and was wondering if she could get a referral to cardiology there. Will forward to Dr. Johnsie Cancel for advisement.

## 2020-08-31 NOTE — Telephone Encounter (Signed)
Pt c/o BP issue: STAT if pt c/o blurred vision, one-sided weakness or slurred speech  1. What are your last 5 BP readings?  08/01/20 93/53 08/07/20 120/53 08/09/20 110/60 08/28/20 100/74 08/30/20 101/52  2. Are you having any other symptoms (ex. Dizziness, headache, blurred vision, passed out)? Dizziness and feeling terrible as if she will pass out (has not passed out)  3. What is your BP issue? Hypotension

## 2020-08-31 NOTE — Telephone Encounter (Signed)
Per Dr. Johnsie Cancel, discontinue amlodipine and cut Toprol to 25 mg. Her primary should be able to handle this. Informed patient of changes and undated medication list.

## 2020-10-23 ENCOUNTER — Other Ambulatory Visit: Payer: Self-pay

## 2020-10-23 MED ORDER — PRAVASTATIN SODIUM 20 MG PO TABS: 20.0000 mg | ORAL_TABLET | Freq: Every evening | ORAL | 1 refills | Status: DC

## 2020-10-23 MED ORDER — LOSARTAN POTASSIUM-HCTZ 100-12.5 MG PO TABS: 1.0000 | ORAL_TABLET | Freq: Every day | ORAL | 1 refills | Status: DC

## 2021-03-17 ENCOUNTER — Other Ambulatory Visit: Payer: Self-pay | Admitting: Cardiovascular Disease

## 2022-06-21 ENCOUNTER — Encounter: Payer: Self-pay | Admitting: Internal Medicine
